# Patient Record
Sex: Female | Born: 1960 | Race: Black or African American | Hispanic: No | Marital: Single | Smoking: Current every day smoker
Health system: Southern US, Community
[De-identification: ages and names within clinical notes are randomized; demographics above are authoritative.]

## PROBLEM LIST (undated history)

## (undated) DIAGNOSIS — C3490 Malignant neoplasm of unspecified part of unspecified bronchus or lung: Secondary | ICD-10-CM

## (undated) HISTORY — DX: Malignant neoplasm of unspecified part of unspecified bronchus or lung: C34.90

---

## 2005-12-09 ENCOUNTER — Other Ambulatory Visit: Payer: Self-pay

## 2005-12-09 ENCOUNTER — Emergency Department: Payer: Self-pay | Admitting: Emergency Medicine

## 2009-05-08 ENCOUNTER — Emergency Department: Payer: Self-pay | Admitting: Emergency Medicine

## 2013-09-24 ENCOUNTER — Emergency Department: Payer: Self-pay | Admitting: Emergency Medicine

## 2013-09-24 LAB — URINALYSIS, COMPLETE
Bilirubin,UR: NEGATIVE
Glucose,UR: NEGATIVE mg/dL (ref 0–75)
Nitrite: NEGATIVE
PROTEIN: NEGATIVE
Ph: 5 (ref 4.5–8.0)
RBC,UR: 5 /HPF (ref 0–5)
Specific Gravity: 1.017 (ref 1.003–1.030)
Squamous Epithelial: 4

## 2013-09-26 LAB — URINE CULTURE

## 2013-11-07 ENCOUNTER — Inpatient Hospital Stay: Payer: Self-pay | Admitting: Internal Medicine

## 2013-11-07 LAB — BASIC METABOLIC PANEL
ANION GAP: 8 (ref 7–16)
BUN: 7 mg/dL (ref 7–18)
CHLORIDE: 100 mmol/L (ref 98–107)
CREATININE: 0.74 mg/dL (ref 0.60–1.30)
Calcium, Total: 9.2 mg/dL (ref 8.5–10.1)
Co2: 28 mmol/L (ref 21–32)
EGFR (Non-African Amer.): >60
GLUCOSE: 87 mg/dL (ref 65–99)
Osmolality: 269 (ref 275–301)
Potassium: 4.2 mmol/L (ref 3.5–5.1)
SODIUM: 136 mmol/L (ref 136–145)

## 2013-11-07 LAB — CBC
HCT: 45.9 % (ref 35.0–47.0)
HGB: 15.1 g/dL (ref 12.0–16.0)
MCH: 32.9 pg (ref 26.0–34.0)
MCHC: 32.9 g/dL (ref 32.0–36.0)
MCV: 100 fL (ref 80–100)
PLATELETS: 270 10*3/uL (ref 150–440)
RBC: 4.58 10*6/uL (ref 3.80–5.20)
RDW: 13.4 % (ref 11.5–14.5)
WBC: 4.3 10*3/uL (ref 3.6–11.0)

## 2013-11-07 LAB — RAPID HIV SCREEN (HIV 1/2 AB+AG)

## 2013-11-07 LAB — ETHANOL
Ethanol %: <0.003 % (ref 0.000–0.080)
Ethanol: <3 mg/dL

## 2013-11-07 LAB — TSH: Thyroid Stimulating Horm: 0.969 u[IU]/mL

## 2013-11-07 LAB — TROPONIN I: Troponin-I: <0.02 ng/mL

## 2013-11-08 LAB — CBC WITH DIFFERENTIAL/PLATELET
Basophil #: 0 10*3/uL (ref 0.0–0.1)
Basophil %: 0.1 %
EOS ABS: 0 10*3/uL (ref 0.0–0.7)
Eosinophil %: 0 %
HCT: 38.2 % (ref 35.0–47.0)
HGB: 12.5 g/dL (ref 12.0–16.0)
Lymphocyte #: 0.6 10*3/uL — ABNORMAL LOW (ref 1.0–3.6)
Lymphocyte %: 19.4 %
MCH: 33.2 pg (ref 26.0–34.0)
MCHC: 32.8 g/dL (ref 32.0–36.0)
MCV: 101 fL — AB (ref 80–100)
MONOS PCT: 1.5 %
Monocyte #: 0 x10 3/mm — ABNORMAL LOW (ref 0.2–0.9)
NEUTROS ABS: 2.3 10*3/uL (ref 1.4–6.5)
NEUTROS PCT: 79 %
PLATELETS: 235 10*3/uL (ref 150–440)
RBC: 3.77 10*6/uL — ABNORMAL LOW (ref 3.80–5.20)
RDW: 13.3 % (ref 11.5–14.5)
WBC: 2.9 10*3/uL — AB (ref 3.6–11.0)

## 2013-11-08 LAB — BASIC METABOLIC PANEL
Anion Gap: 8 (ref 7–16)
BUN: 9 mg/dL (ref 7–18)
CO2: 25 mmol/L (ref 21–32)
Calcium, Total: 7.6 mg/dL — ABNORMAL LOW (ref 8.5–10.1)
Chloride: 105 mmol/L (ref 98–107)
Creatinine: 0.54 mg/dL — ABNORMAL LOW (ref 0.60–1.30)
EGFR (African American): >60
EGFR (Non-African Amer.): >60
Glucose: 149 mg/dL — ABNORMAL HIGH (ref 65–99)
OSMOLALITY: 277 (ref 275–301)
Potassium: 4.3 mmol/L (ref 3.5–5.1)
SODIUM: 138 mmol/L (ref 136–145)

## 2013-11-09 LAB — DRUG SCREEN, URINE

## 2013-11-12 LAB — CULTURE, BLOOD (SINGLE)

## 2013-11-15 ENCOUNTER — Ambulatory Visit: Payer: Self-pay | Admitting: Oncology

## 2013-11-19 ENCOUNTER — Inpatient Hospital Stay: Payer: Self-pay | Admitting: Internal Medicine

## 2013-11-19 LAB — COMPREHENSIVE METABOLIC PANEL
ALK PHOS: 119 U/L — AB
AST: 32 U/L (ref 15–37)
Albumin: 2.5 g/dL — ABNORMAL LOW (ref 3.4–5.0)
Anion Gap: 10 (ref 7–16)
BUN: 8 mg/dL (ref 7–18)
Bilirubin,Total: 0.2 mg/dL (ref 0.2–1.0)
CALCIUM: 8.4 mg/dL — AB (ref 8.5–10.1)
CHLORIDE: 107 mmol/L (ref 98–107)
CO2: 26 mmol/L (ref 21–32)
Creatinine: 0.57 mg/dL — ABNORMAL LOW (ref 0.60–1.30)
EGFR (African American): >60
EGFR (Non-African Amer.): >60
GLUCOSE: 95 mg/dL (ref 65–99)
OSMOLALITY: 283 (ref 275–301)
POTASSIUM: 3.4 mmol/L — AB (ref 3.5–5.1)
SGPT (ALT): 47 U/L
SODIUM: 143 mmol/L (ref 136–145)
Total Protein: 7.2 g/dL (ref 6.4–8.2)

## 2013-11-19 LAB — DRUG SCREEN, URINE
AMPHETAMINES, UR SCREEN: NEGATIVE (ref ?–1000)
BENZODIAZEPINE, UR SCRN: NEGATIVE (ref ?–200)
Barbiturates, Ur Screen: NEGATIVE (ref ?–200)
COCAINE METABOLITE, UR ~~LOC~~: NEGATIVE (ref ?–300)
Cannabinoid 50 Ng, Ur ~~LOC~~: NEGATIVE (ref ?–50)
MDMA (ECSTASY) UR SCREEN: NEGATIVE (ref ?–500)
METHADONE, UR SCREEN: NEGATIVE (ref ?–300)
Opiate, Ur Screen: NEGATIVE (ref ?–300)
PHENCYCLIDINE (PCP) UR S: NEGATIVE (ref ?–25)
Tricyclic, Ur Screen: NEGATIVE (ref ?–1000)

## 2013-11-19 LAB — CBC
HCT: 38.7 % (ref 35.0–47.0)
HGB: 13 g/dL (ref 12.0–16.0)
MCH: 33.5 pg (ref 26.0–34.0)
MCHC: 33.7 g/dL (ref 32.0–36.0)
MCV: 100 fL (ref 80–100)
Platelet: 269 10*3/uL (ref 150–440)
RBC: 3.89 10*6/uL (ref 3.80–5.20)
RDW: 13.4 % (ref 11.5–14.5)
WBC: 4.6 10*3/uL (ref 3.6–11.0)

## 2013-11-19 LAB — PROTIME-INR
INR: 1
PROTHROMBIN TIME: 12.7 s (ref 11.5–14.7)

## 2013-11-19 LAB — TROPONIN I

## 2013-11-20 LAB — BASIC METABOLIC PANEL
ANION GAP: 8 (ref 7–16)
BUN: 11 mg/dL (ref 7–18)
CALCIUM: 8.4 mg/dL — AB (ref 8.5–10.1)
CHLORIDE: 107 mmol/L (ref 98–107)
Co2: 26 mmol/L (ref 21–32)
Creatinine: 0.77 mg/dL (ref 0.60–1.30)
EGFR (African American): >60
Glucose: 141 mg/dL — ABNORMAL HIGH (ref 65–99)
Osmolality: 283 (ref 275–301)
Potassium: 4.1 mmol/L (ref 3.5–5.1)
Sodium: 141 mmol/L (ref 136–145)

## 2013-11-20 LAB — CBC WITH DIFFERENTIAL/PLATELET
BASOS ABS: 0.1 10*3/uL (ref 0.0–0.1)
Basophil %: 0.7 %
Eosinophil #: 0 10*3/uL (ref 0.0–0.7)
Eosinophil %: 0 %
HCT: 38 % (ref 35.0–47.0)
HGB: 12.9 g/dL (ref 12.0–16.0)
LYMPHS PCT: 7 %
Lymphocyte #: 0.7 10*3/uL — ABNORMAL LOW (ref 1.0–3.6)
MCH: 34.1 pg — AB (ref 26.0–34.0)
MCHC: 33.9 g/dL (ref 32.0–36.0)
MCV: 101 fL — ABNORMAL HIGH (ref 80–100)
MONO ABS: 0.1 x10 3/mm — AB (ref 0.2–0.9)
Monocyte %: 1.2 %
NEUTROS PCT: 91.1 %
Neutrophil #: 9.1 10*3/uL — ABNORMAL HIGH (ref 1.4–6.5)
PLATELETS: 240 10*3/uL (ref 150–440)
RBC: 3.77 10*6/uL — AB (ref 3.80–5.20)
RDW: 13.5 % (ref 11.5–14.5)
WBC: 10 10*3/uL (ref 3.6–11.0)

## 2013-11-26 ENCOUNTER — Ambulatory Visit: Payer: Self-pay | Admitting: Oncology

## 2013-12-08 ENCOUNTER — Ambulatory Visit: Payer: Self-pay | Admitting: Vascular Surgery

## 2013-12-09 ENCOUNTER — Ambulatory Visit: Payer: Self-pay | Admitting: Oncology

## 2013-12-13 ENCOUNTER — Emergency Department: Payer: Self-pay | Admitting: Emergency Medicine

## 2013-12-13 LAB — COMPREHENSIVE METABOLIC PANEL
ANION GAP: 16 (ref 7–16)
Albumin: 3 g/dL — ABNORMAL LOW (ref 3.4–5.0)
Alkaline Phosphatase: 156 U/L — ABNORMAL HIGH
BUN: 7 mg/dL (ref 7–18)
Bilirubin,Total: 0.5 mg/dL (ref 0.2–1.0)
Calcium, Total: 8.7 mg/dL (ref 8.5–10.1)
Chloride: 99 mmol/L (ref 98–107)
Co2: 19 mmol/L — ABNORMAL LOW (ref 21–32)
Creatinine: 0.79 mg/dL (ref 0.60–1.30)
EGFR (African American): >60
EGFR (Non-African Amer.): >60
GLUCOSE: 100 mg/dL — AB (ref 65–99)
OSMOLALITY: 266 (ref 275–301)
Potassium: 3.6 mmol/L (ref 3.5–5.1)
SGOT(AST): 38 U/L — ABNORMAL HIGH (ref 15–37)
SGPT (ALT): 37 U/L
Sodium: 134 mmol/L — ABNORMAL LOW (ref 136–145)
TOTAL PROTEIN: 7.3 g/dL (ref 6.4–8.2)

## 2013-12-13 LAB — URINALYSIS, COMPLETE
BILIRUBIN, UR: NEGATIVE
GLUCOSE, UR: NEGATIVE mg/dL (ref 0–75)
KETONE: NEGATIVE
NITRITE: NEGATIVE
PH: 5 (ref 4.5–8.0)
Protein: NEGATIVE
SPECIFIC GRAVITY: 1.01 (ref 1.003–1.030)
Squamous Epithelial: 4
WBC UR: 13 /HPF (ref 0–5)

## 2013-12-13 LAB — CBC
HCT: 41.8 % (ref 35.0–47.0)
HGB: 13.6 g/dL (ref 12.0–16.0)
MCH: 32 pg (ref 26.0–34.0)
MCHC: 32.5 g/dL (ref 32.0–36.0)
MCV: 98 fL (ref 80–100)
Platelet: 209 10*3/uL (ref 150–440)
RBC: 4.25 10*6/uL (ref 3.80–5.20)
RDW: 13.9 % (ref 11.5–14.5)
WBC: 5.8 10*3/uL (ref 3.6–11.0)

## 2013-12-16 ENCOUNTER — Ambulatory Visit: Payer: Self-pay | Admitting: Oncology

## 2013-12-19 LAB — COMPREHENSIVE METABOLIC PANEL
Albumin: 3.1 g/dL — ABNORMAL LOW (ref 3.4–5.0)
Alkaline Phosphatase: 123 U/L — ABNORMAL HIGH
Anion Gap: 10 (ref 7–16)
BUN: 9 mg/dL (ref 7–18)
Bilirubin,Total: 0.4 mg/dL (ref 0.2–1.0)
CHLORIDE: 96 mmol/L — AB (ref 98–107)
Calcium, Total: 8.9 mg/dL (ref 8.5–10.1)
Co2: 27 mmol/L (ref 21–32)
Creatinine: 0.67 mg/dL (ref 0.60–1.30)
EGFR (African American): >60
Glucose: 115 mg/dL — ABNORMAL HIGH (ref 65–99)
Osmolality: 266 (ref 275–301)
Potassium: 3.2 mmol/L — ABNORMAL LOW (ref 3.5–5.1)
SGOT(AST): 19 U/L (ref 15–37)
SGPT (ALT): 21 U/L
Sodium: 133 mmol/L — ABNORMAL LOW (ref 136–145)
Total Protein: 7.2 g/dL (ref 6.4–8.2)

## 2013-12-19 LAB — CBC CANCER CENTER
BASOS ABS: 0 x10 3/mm (ref 0.0–0.1)
Basophil %: 0.7 %
EOS PCT: 0.4 %
Eosinophil #: 0 x10 3/mm (ref 0.0–0.7)
HCT: 39.2 % (ref 35.0–47.0)
HGB: 13.2 g/dL (ref 12.0–16.0)
LYMPHS PCT: 15.1 %
Lymphocyte #: 0.9 x10 3/mm — ABNORMAL LOW (ref 1.0–3.6)
MCH: 32.4 pg (ref 26.0–34.0)
MCHC: 33.6 g/dL (ref 32.0–36.0)
MCV: 96 fL (ref 80–100)
Monocyte #: 0.5 x10 3/mm (ref 0.2–0.9)
Monocyte %: 8.4 %
Neutrophil #: 4.5 x10 3/mm (ref 1.4–6.5)
Neutrophil %: 75.4 %
PLATELETS: 285 x10 3/mm (ref 150–440)
RBC: 4.07 10*6/uL (ref 3.80–5.20)
RDW: 13.4 % (ref 11.5–14.5)
WBC: 6 x10 3/mm (ref 3.6–11.0)

## 2013-12-19 LAB — MAGNESIUM: MAGNESIUM: 1.4 mg/dL — AB

## 2013-12-26 LAB — COMPREHENSIVE METABOLIC PANEL
ALBUMIN: 3 g/dL — AB (ref 3.4–5.0)
ALK PHOS: 190 U/L — AB
ALT: 70 U/L — AB
ANION GAP: 9 (ref 7–16)
AST: 42 U/L — AB (ref 15–37)
BUN: 15 mg/dL (ref 7–18)
Bilirubin,Total: 0.3 mg/dL (ref 0.2–1.0)
CALCIUM: 8.3 mg/dL — AB (ref 8.5–10.1)
CREATININE: 0.69 mg/dL (ref 0.60–1.30)
Chloride: 96 mmol/L — ABNORMAL LOW (ref 98–107)
Co2: 31 mmol/L (ref 21–32)
EGFR (African American): >60
EGFR (Non-African Amer.): >60
Glucose: 102 mg/dL — ABNORMAL HIGH (ref 65–99)
Osmolality: 273 (ref 275–301)
Potassium: 2.8 mmol/L — ABNORMAL LOW (ref 3.5–5.1)
Sodium: 136 mmol/L (ref 136–145)
TOTAL PROTEIN: 7.1 g/dL (ref 6.4–8.2)

## 2013-12-26 LAB — CBC CANCER CENTER
Basophil #: 0 x10 3/mm (ref 0.0–0.1)
Basophil %: 0.6 %
EOS ABS: 0 x10 3/mm (ref 0.0–0.7)
EOS PCT: 0.5 %
HCT: 40 % (ref 35.0–47.0)
HGB: 13.1 g/dL (ref 12.0–16.0)
Lymphocyte #: 0.8 x10 3/mm — ABNORMAL LOW (ref 1.0–3.6)
Lymphocyte %: 31.8 %
MCH: 31.9 pg (ref 26.0–34.0)
MCHC: 32.8 g/dL (ref 32.0–36.0)
MCV: 97 fL (ref 80–100)
Monocyte #: 0.1 x10 3/mm — ABNORMAL LOW (ref 0.2–0.9)
Monocyte %: 5.1 %
NEUTROS PCT: 62 %
Neutrophil #: 1.5 x10 3/mm (ref 1.4–6.5)
PLATELETS: 222 x10 3/mm (ref 150–440)
RBC: 4.12 10*6/uL (ref 3.80–5.20)
RDW: 13.2 % (ref 11.5–14.5)
WBC: 2.5 x10 3/mm — AB (ref 3.6–11.0)

## 2013-12-26 LAB — MAGNESIUM: Magnesium: 1.5 mg/dL — ABNORMAL LOW

## 2014-01-08 LAB — COMPREHENSIVE METABOLIC PANEL
ALK PHOS: 193 U/L — AB
ANION GAP: 8 (ref 7–16)
AST: 20 U/L (ref 15–37)
Albumin: 3.2 g/dL — ABNORMAL LOW (ref 3.4–5.0)
BILIRUBIN TOTAL: 0.2 mg/dL (ref 0.2–1.0)
BUN: 12 mg/dL (ref 7–18)
CALCIUM: 8.5 mg/dL (ref 8.5–10.1)
CO2: 28 mmol/L (ref 21–32)
CREATININE: 0.68 mg/dL (ref 0.60–1.30)
Chloride: 102 mmol/L (ref 98–107)
EGFR (Non-African Amer.): >60
GLUCOSE: 128 mg/dL — AB (ref 65–99)
OSMOLALITY: 277 (ref 275–301)
Potassium: 3.9 mmol/L (ref 3.5–5.1)
SGPT (ALT): 30 U/L
Sodium: 138 mmol/L (ref 136–145)
Total Protein: 7.2 g/dL (ref 6.4–8.2)

## 2014-01-08 LAB — CBC CANCER CENTER
BASOS ABS: 0 x10 3/mm (ref 0.0–0.1)
Basophil %: 0.4 %
Eosinophil #: 0 x10 3/mm (ref 0.0–0.7)
Eosinophil %: 0.1 %
HCT: 37.5 % (ref 35.0–47.0)
HGB: 12.8 g/dL (ref 12.0–16.0)
Lymphocyte #: 0.9 x10 3/mm — ABNORMAL LOW (ref 1.0–3.6)
Lymphocyte %: 12.7 %
MCH: 32.9 pg (ref 26.0–34.0)
MCHC: 34 g/dL (ref 32.0–36.0)
MCV: 97 fL (ref 80–100)
MONOS PCT: 7.1 %
Monocyte #: 0.5 x10 3/mm (ref 0.2–0.9)
NEUTROS PCT: 79.7 %
Neutrophil #: 5.6 x10 3/mm (ref 1.4–6.5)
Platelet: 178 x10 3/mm (ref 150–440)
RBC: 3.88 10*6/uL (ref 3.80–5.20)
RDW: 13.7 % (ref 11.5–14.5)
WBC: 7.1 x10 3/mm (ref 3.6–11.0)

## 2014-01-15 ENCOUNTER — Ambulatory Visit: Payer: Self-pay | Admitting: Oncology

## 2014-01-29 LAB — COMPREHENSIVE METABOLIC PANEL
ALK PHOS: 148 U/L — AB
ANION GAP: 8 (ref 7–16)
Albumin: 3 g/dL — ABNORMAL LOW (ref 3.4–5.0)
BILIRUBIN TOTAL: 0.3 mg/dL (ref 0.2–1.0)
BUN: 7 mg/dL (ref 7–18)
Calcium, Total: 9.3 mg/dL (ref 8.5–10.1)
Chloride: 106 mmol/L (ref 98–107)
Co2: 26 mmol/L (ref 21–32)
Creatinine: 0.79 mg/dL (ref 0.60–1.30)
EGFR (Non-African Amer.): >60
GLUCOSE: 114 mg/dL — AB (ref 65–99)
Osmolality: 278 (ref 275–301)
Potassium: 3.5 mmol/L (ref 3.5–5.1)
SGOT(AST): 21 U/L (ref 15–37)
SGPT (ALT): 33 U/L
Sodium: 140 mmol/L (ref 136–145)
Total Protein: 7 g/dL (ref 6.4–8.2)

## 2014-01-29 LAB — CBC CANCER CENTER
BASOS PCT: 0.5 %
Basophil #: 0 x10 3/mm (ref 0.0–0.1)
Eosinophil #: 0 x10 3/mm (ref 0.0–0.7)
Eosinophil %: 0.2 %
HCT: 35 % (ref 35.0–47.0)
HGB: 11.8 g/dL — ABNORMAL LOW (ref 12.0–16.0)
LYMPHS ABS: 0.8 x10 3/mm — AB (ref 1.0–3.6)
LYMPHS PCT: 25.7 %
MCH: 32.6 pg (ref 26.0–34.0)
MCHC: 33.9 g/dL (ref 32.0–36.0)
MCV: 96 fL (ref 80–100)
Monocyte #: 0.4 x10 3/mm (ref 0.2–0.9)
Monocyte %: 14.1 %
NEUTROS PCT: 59.5 %
Neutrophil #: 1.8 x10 3/mm (ref 1.4–6.5)
Platelet: 321 x10 3/mm (ref 150–440)
RBC: 3.64 10*6/uL — AB (ref 3.80–5.20)
RDW: 15.2 % — AB (ref 11.5–14.5)
WBC: 3 x10 3/mm — ABNORMAL LOW (ref 3.6–11.0)

## 2014-02-15 ENCOUNTER — Ambulatory Visit: Payer: Self-pay | Admitting: Oncology

## 2014-02-19 LAB — CBC CANCER CENTER
BASOS ABS: 0 x10 3/mm (ref 0.0–0.1)
Basophil %: 0.4 %
EOS PCT: 0.1 %
Eosinophil #: 0 x10 3/mm (ref 0.0–0.7)
HCT: 33.6 % — ABNORMAL LOW (ref 35.0–47.0)
HGB: 11.3 g/dL — AB (ref 12.0–16.0)
LYMPHS PCT: 19.5 %
Lymphocyte #: 0.9 x10 3/mm — ABNORMAL LOW (ref 1.0–3.6)
MCH: 32.7 pg (ref 26.0–34.0)
MCHC: 33.5 g/dL (ref 32.0–36.0)
MCV: 98 fL (ref 80–100)
MONO ABS: 0.7 x10 3/mm (ref 0.2–0.9)
Monocyte %: 15.1 %
NEUTROS PCT: 64.9 %
Neutrophil #: 2.9 x10 3/mm (ref 1.4–6.5)
Platelet: 236 x10 3/mm (ref 150–440)
RBC: 3.45 10*6/uL — ABNORMAL LOW (ref 3.80–5.20)
RDW: 17 % — AB (ref 11.5–14.5)
WBC: 4.4 x10 3/mm (ref 3.6–11.0)

## 2014-02-19 LAB — COMPREHENSIVE METABOLIC PANEL
ALK PHOS: 122 U/L — AB
ALT: 29 U/L
ANION GAP: 11 (ref 7–16)
Albumin: 3.3 g/dL — ABNORMAL LOW (ref 3.4–5.0)
BILIRUBIN TOTAL: 0.4 mg/dL (ref 0.2–1.0)
BUN: 6 mg/dL — ABNORMAL LOW (ref 7–18)
Calcium, Total: 9.3 mg/dL (ref 8.5–10.1)
Chloride: 100 mmol/L (ref 98–107)
Co2: 27 mmol/L (ref 21–32)
Creatinine: 0.68 mg/dL (ref 0.60–1.30)
EGFR (African American): >60
EGFR (Non-African Amer.): >60
Glucose: 107 mg/dL — ABNORMAL HIGH (ref 65–99)
Osmolality: 274 (ref 275–301)
Potassium: 3.5 mmol/L (ref 3.5–5.1)
SGOT(AST): 24 U/L (ref 15–37)
SODIUM: 138 mmol/L (ref 136–145)
Total Protein: 7.3 g/dL (ref 6.4–8.2)

## 2014-03-17 ENCOUNTER — Ambulatory Visit: Payer: Self-pay | Admitting: Oncology

## 2014-03-19 LAB — COMPREHENSIVE METABOLIC PANEL
ALBUMIN: 3.4 g/dL (ref 3.4–5.0)
AST: 24 U/L (ref 15–37)
Alkaline Phosphatase: 99 U/L
Anion Gap: 10 (ref 7–16)
BILIRUBIN TOTAL: 0.8 mg/dL (ref 0.2–1.0)
BUN: 10 mg/dL (ref 7–18)
Calcium, Total: 9.2 mg/dL (ref 8.5–10.1)
Chloride: 100 mmol/L (ref 98–107)
Co2: 27 mmol/L (ref 21–32)
Creatinine: 0.69 mg/dL (ref 0.60–1.30)
EGFR (African American): >60
EGFR (Non-African Amer.): >60
Glucose: 99 mg/dL (ref 65–99)
OSMOLALITY: 273 (ref 275–301)
POTASSIUM: 3.7 mmol/L (ref 3.5–5.1)
SGPT (ALT): 24 U/L
SODIUM: 137 mmol/L (ref 136–145)
TOTAL PROTEIN: 7.5 g/dL (ref 6.4–8.2)

## 2014-03-19 LAB — CBC CANCER CENTER
Basophil #: 0 x10 3/mm (ref 0.0–0.1)
Basophil %: 0.9 %
Eosinophil #: 0 x10 3/mm (ref 0.0–0.7)
Eosinophil %: 0.5 %
HCT: 35 % (ref 35.0–47.0)
HGB: 11.6 g/dL — ABNORMAL LOW (ref 12.0–16.0)
LYMPHS PCT: 22.5 %
Lymphocyte #: 0.9 x10 3/mm — ABNORMAL LOW (ref 1.0–3.6)
MCH: 32.9 pg (ref 26.0–34.0)
MCHC: 33.2 g/dL (ref 32.0–36.0)
MCV: 99 fL (ref 80–100)
MONO ABS: 0.4 x10 3/mm (ref 0.2–0.9)
Monocyte %: 9 %
Neutrophil #: 2.7 x10 3/mm (ref 1.4–6.5)
Neutrophil %: 67.1 %
PLATELETS: 243 x10 3/mm (ref 150–440)
RBC: 3.53 10*6/uL — ABNORMAL LOW (ref 3.80–5.20)
RDW: 17.2 % — ABNORMAL HIGH (ref 11.5–14.5)
WBC: 4.1 x10 3/mm (ref 3.6–11.0)

## 2014-04-07 LAB — CBC CANCER CENTER
BASOS ABS: 0 x10 3/mm (ref 0.0–0.1)
Basophil %: 0.4 %
Eosinophil #: 0 x10 3/mm (ref 0.0–0.7)
Eosinophil %: 0 %
HCT: 34.1 % — AB (ref 35.0–47.0)
HGB: 11.3 g/dL — ABNORMAL LOW (ref 12.0–16.0)
Lymphocyte #: 1.4 x10 3/mm (ref 1.0–3.6)
Lymphocyte %: 16.9 %
MCH: 33.3 pg (ref 26.0–34.0)
MCHC: 33.2 g/dL (ref 32.0–36.0)
MCV: 100 fL (ref 80–100)
MONOS PCT: 11.4 %
Monocyte #: 0.9 x10 3/mm (ref 0.2–0.9)
Neutrophil #: 5.8 x10 3/mm (ref 1.4–6.5)
Neutrophil %: 71.3 %
PLATELETS: 201 x10 3/mm (ref 150–440)
RBC: 3.39 10*6/uL — ABNORMAL LOW (ref 3.80–5.20)
RDW: 16.9 % — ABNORMAL HIGH (ref 11.5–14.5)
WBC: 8.2 x10 3/mm (ref 3.6–11.0)

## 2014-04-07 LAB — COMPREHENSIVE METABOLIC PANEL
ALT: 73 U/L — AB
ANION GAP: 9 (ref 7–16)
Albumin: 3.2 g/dL — ABNORMAL LOW (ref 3.4–5.0)
Alkaline Phosphatase: 102 U/L
BILIRUBIN TOTAL: 0.2 mg/dL (ref 0.2–1.0)
BUN: 13 mg/dL (ref 7–18)
Calcium, Total: 8.7 mg/dL (ref 8.5–10.1)
Chloride: 99 mmol/L (ref 98–107)
Co2: 29 mmol/L (ref 21–32)
Creatinine: 0.6 mg/dL (ref 0.60–1.30)
Glucose: 96 mg/dL (ref 65–99)
OSMOLALITY: 274 (ref 275–301)
POTASSIUM: 4.2 mmol/L (ref 3.5–5.1)
SGOT(AST): 47 U/L — ABNORMAL HIGH (ref 15–37)
Sodium: 137 mmol/L (ref 136–145)
Total Protein: 7.2 g/dL (ref 6.4–8.2)

## 2014-04-17 ENCOUNTER — Ambulatory Visit: Payer: Self-pay | Admitting: Oncology

## 2014-05-05 LAB — CBC CANCER CENTER
BASOS ABS: 0.1 x10 3/mm (ref 0.0–0.1)
Basophil %: 1.1 %
EOS ABS: 0 x10 3/mm (ref 0.0–0.7)
EOS PCT: 0.2 %
HCT: 36.3 % (ref 35.0–47.0)
HGB: 12 g/dL (ref 12.0–16.0)
LYMPHS PCT: 16.6 %
Lymphocyte #: 1.1 x10 3/mm (ref 1.0–3.6)
MCH: 33.5 pg (ref 26.0–34.0)
MCHC: 33.2 g/dL (ref 32.0–36.0)
MCV: 101 fL — ABNORMAL HIGH (ref 80–100)
Monocyte #: 0.9 x10 3/mm (ref 0.2–0.9)
Monocyte %: 12.8 %
NEUTROS PCT: 69.3 %
Neutrophil #: 4.6 x10 3/mm (ref 1.4–6.5)
Platelet: 213 x10 3/mm (ref 150–440)
RBC: 3.59 10*6/uL — AB (ref 3.80–5.20)
RDW: 17.2 % — ABNORMAL HIGH (ref 11.5–14.5)
WBC: 6.7 x10 3/mm (ref 3.6–11.0)

## 2014-05-05 LAB — COMPREHENSIVE METABOLIC PANEL
ALBUMIN: 3.3 g/dL — AB (ref 3.4–5.0)
AST: 11 U/L — AB (ref 15–37)
Alkaline Phosphatase: 103 U/L
Anion Gap: 9 (ref 7–16)
BUN: 13 mg/dL (ref 7–18)
Bilirubin,Total: 0.4 mg/dL (ref 0.2–1.0)
CALCIUM: 8.4 mg/dL — AB (ref 8.5–10.1)
CO2: 29 mmol/L (ref 21–32)
Chloride: 101 mmol/L (ref 98–107)
Creatinine: 0.62 mg/dL (ref 0.60–1.30)
EGFR (African American): >60
EGFR (Non-African Amer.): >60
GLUCOSE: 120 mg/dL — AB (ref 65–99)
Osmolality: 279 (ref 275–301)
Potassium: 3.2 mmol/L — ABNORMAL LOW (ref 3.5–5.1)
SGPT (ALT): 21 U/L
Sodium: 139 mmol/L (ref 136–145)
Total Protein: 7.3 g/dL (ref 6.4–8.2)

## 2014-05-18 ENCOUNTER — Ambulatory Visit: Payer: Self-pay | Admitting: Oncology

## 2014-06-16 ENCOUNTER — Ambulatory Visit
Admit: 2014-06-16 | Disposition: A | Discharge status: Home / Self Care | Payer: Self-pay | Attending: Oncology | Admitting: Oncology

## 2014-06-23 LAB — CREATININE, SERUM

## 2014-07-14 LAB — PROTEIN, URINE, RANDOM: PROTEIN, URINE: 9 mg/dL (ref 0–9)

## 2014-07-17 ENCOUNTER — Ambulatory Visit
Admit: 2014-07-17 | Disposition: A | Discharge status: Home / Self Care | Payer: Self-pay | Attending: Oncology | Admitting: Oncology

## 2014-08-05 LAB — COMPREHENSIVE METABOLIC PANEL
ANION GAP: 11 (ref 7–16)
Albumin: 4.4 g/dL
Alkaline Phosphatase: 68 U/L
BILIRUBIN TOTAL: 1.1 mg/dL
BUN: 19 mg/dL
CHLORIDE: 100 mmol/L — AB
CO2: 27 mmol/L
CREATININE: 0.64 mg/dL
Calcium, Total: 9.8 mg/dL
EGFR (African American): >60
Glucose: 115 mg/dL — ABNORMAL HIGH
POTASSIUM: 4.4 mmol/L
SGOT(AST): 27 U/L
SGPT (ALT): 27 U/L
Sodium: 138 mmol/L
TOTAL PROTEIN: 8.2 g/dL — AB

## 2014-08-05 LAB — URINALYSIS, COMPLETE
Bilirubin,UR: NEGATIVE
Blood: NEGATIVE
Glucose,UR: NEGATIVE mg/dL (ref 0–75)
Leukocyte Esterase: NEGATIVE
NITRITE: NEGATIVE
PH: 7 (ref 4.5–8.0)
Specific Gravity: 1.049 (ref 1.003–1.030)

## 2014-08-05 LAB — DRUG SCREEN, URINE
Amphetamines, Ur Screen: NEGATIVE
Barbiturates, Ur Screen: NEGATIVE
Benzodiazepine, Ur Scrn: NEGATIVE
CANNABINOID 50 NG, UR ~~LOC~~: NEGATIVE
Cocaine Metabolite,Ur ~~LOC~~: POSITIVE
MDMA (ECSTASY) UR SCREEN: NEGATIVE
METHADONE, UR SCREEN: NEGATIVE
Opiate, Ur Screen: NEGATIVE
PHENCYCLIDINE (PCP) UR S: NEGATIVE
Tricyclic, Ur Screen: NEGATIVE

## 2014-08-05 LAB — CBC
HCT: 47.2 % — ABNORMAL HIGH (ref 35.0–47.0)
HGB: 15.6 g/dL (ref 12.0–16.0)
MCH: 32.8 pg (ref 26.0–34.0)
MCHC: 33.1 g/dL (ref 32.0–36.0)
MCV: 99 fL (ref 80–100)
PLATELETS: 190 10*3/uL (ref 150–440)
RBC: 4.77 10*6/uL (ref 3.80–5.20)
RDW: 14.4 % (ref 11.5–14.5)
WBC: 6.8 10*3/uL (ref 3.6–11.0)

## 2014-08-05 LAB — AMMONIA: Ammonia, Plasma: 11 umol/L

## 2014-08-05 LAB — TROPONIN I: Troponin-I: <0.03 ng/mL

## 2014-08-06 ENCOUNTER — Inpatient Hospital Stay
Admit: 2014-08-06 | Disposition: A | Discharge status: Hospice | Payer: Self-pay | Attending: Internal Medicine | Admitting: Internal Medicine

## 2014-08-06 LAB — AMMONIA: Ammonia, Plasma: 17 umol/L

## 2014-08-07 LAB — CBC WITH DIFFERENTIAL/PLATELET
BASOS ABS: 0 10*3/uL (ref 0.0–0.1)
Basophil %: 0.6 %
EOS ABS: 0 10*3/uL (ref 0.0–0.7)
Eosinophil %: 0.8 %
HCT: 37.4 % (ref 35.0–47.0)
HGB: 12.4 g/dL (ref 12.0–16.0)
LYMPHS ABS: 0.9 10*3/uL — AB (ref 1.0–3.6)
Lymphocyte %: 25.7 %
MCH: 32.6 pg (ref 26.0–34.0)
MCHC: 33.1 g/dL (ref 32.0–36.0)
MCV: 98 fL (ref 80–100)
MONOS PCT: 11.4 %
Monocyte #: 0.4 x10 3/mm (ref 0.2–0.9)
Neutrophil #: 2.2 10*3/uL (ref 1.4–6.5)
Neutrophil %: 61.5 %
PLATELETS: 162 10*3/uL (ref 150–440)
RBC: 3.8 10*6/uL (ref 3.80–5.20)
RDW: 14.1 % (ref 11.5–14.5)
WBC: 3.6 10*3/uL (ref 3.6–11.0)

## 2014-08-07 LAB — BASIC METABOLIC PANEL
ANION GAP: 3 — AB (ref 7–16)
BUN: 9 mg/dL
CALCIUM: 8.3 mg/dL — AB
Chloride: 111 mmol/L
Co2: 24 mmol/L
Creatinine: 0.44 mg/dL
EGFR (African American): >60
EGFR (Non-African Amer.): >60
Glucose: 98 mg/dL
Potassium: 3.6 mmol/L
Sodium: 138 mmol/L

## 2014-08-08 NOTE — H&P (Signed)
PATIENT NAME:  Virginia Carlson, [name] MR#:  [id number] DATE OF BIRTH:  02-01-1961  DATE OF ADMISSION:  11/07/2013  PRIMARY CARE PROVIDER: None.   EMERGENCY DEPARTMENT REFERRING PHYSICIAN: Dr. Reita Cliche.   CHIEF COMPLAINT: Shortness of breath and cough ongoing for 2 weeks.   HISTORY OF PRESENT ILLNESS: The patient is a 54 year old African American female with a history of nicotine addiction, alcohol use, history of cocaine use presents with shortness of breath and coughing, ongoing for the past 2 weeks. She reports that she has had a productive cough with darkish color yellowish sputum. Has not had any fevers, but has had progressive shortness of breath and has been wheezing. She also has had a right-sided chest pain. She has lost about 3 pounds in the past few weeks. She denies any nausea, vomiting, or diarrhea. Denies any abdominal pain. Does complain of wheezing. No history of COPD or any other lung disease.   PAST MEDICAL HISTORY: None.   PAST SURGICAL HISTORY: None.   ALLERGIES: NONE.   MEDICATIONS: None.   SOCIAL HISTORY: Smokes 1 pack per day for 20 years. Drinks two 40 ounces daily.  Drug use, cocaine use.   FAMILY HISTORY: No history of lung-related disease. There is some sort of cancer. She is not familiar with what type of cancer.   REVIEW OF SYSTEMS:  CONSTITUTIONAL: Denies any fevers. Complains of fatigue, weakness, and weight loss.  EYES: No blurred or double vision. No pain. No redness. No inflammation. No glaucoma. No cataracts.  EAR, NOSE, AND THROAT: No tinnitus. No ear pain. No hearing loss. No seasonal or year-round allergies. No epistaxis. No nasal discharge. No difficulty swallowing.  RESPIRATORY: Complains of cough, productive wheezing, no hemoptysis. Complains of dyspnea. Complains of right-sided chest pain. No COPD. No TB. No pneumonia in the past.  CARDIOVASCULAR: Denies any orthopnea, edema, or arrhythmia. Complains of dyspnea on exertion. No palpitation. No syncope.   GASTROINTESTINAL: No nausea, vomiting, diarrhea. No abdominal pain. No hematemesis. No melena. No ulcer. No GERD. No IBS. No jaundice. No rectal bleed.  GENITOURINARY: Denies any dysuria, hematuria, renal calculus, or frequency.  ENDOCRINE: Denies any polyuria, nocturia, or thyroid problems.  HEMATOLOGIC AND LYMPHATIC: Denies anemia, easy bruisability or bleeding.  SKIN: No acne. No rash.  MUSCULOSKELETAL: Denies any neck pain in the neck, back or shoulder.  NEUROLOGICAL: No numbness, CVA, or TIA.  PSYCHIATRIC: No anxiety, insomnia or ADD.   PHYSICAL EXAMINATION:  VITAL SIGNS: Temperature 98.2, pulse 112, respirations 26, blood pressure 117/78, O2 88% on room air.  GENERAL: The patient is a thin African American in mild distress with mild tachypneic and tachycardia.  HEENT: Head atraumatic, normocephalic. Pupils equally round and reactive to light and accommodation. There is no conjunctival pallor. No scleral icterus. Nasal exam shows no drainage or ulceration.  Oropharynx is clear without any exudate.  NECK: Supple without any JVD.  CARDIOVASCULAR: Tachycardic. No murmurs, rubs, clicks, or gallops.  LUNGS: Right-sided rhonchi without any active wheezing currently; was wheezing earlier. There is some accessory usage.  ABDOMEN: Soft, nontender, nondistended. Positive bowel sounds x 4. There is no hepatosplenomegaly.  EXTREMITIES: No clubbing, cyanosis, or edema.  SKIN: No rash.  LYMPHATICS: No lymph nodes palpable.  VASCULAR: Good DP, PT pulses.  PSYCHIATRIC: Not anxious or depressed.  NEUROLOGIC: Awake, alert, oriented x 3. No focal deficits JVD. Pulses.   LABORATORY DATA: Glucose 87, BUN 7, creatinine 0.74, sodium 136, potassium 4.2, chloride 100, CO2 28, calcium 9.2. Troponin less than 0.02. WBC 4.3,  hemoglobin 15.1, platelet count 270. PA and lateral chest x-ray shows pneumonia involving the entire right lung, most confluent in the right middle lobe where there is associated atelectasis.  Small right pleural effusion.  ASSESSMENT AND PLAN: The patient is a 54 year old African American female with history of nicotine addiction, alcohol use, and cocaine use who presents with shortness of breath and cough.   1.  Acute respiratory failure evident by tachypnea and hypoxia due to the extensive right-sided pneumonia. In light of somebody who uses alcohol and cocaine, do need to consider aspiration pneumonia. We will treat her with IV Levaquin, that should treat community-acquired pneumonia, as well as aspiration pneumonia. The appearance of the pneumonia seems to be atypical.  I will have pulmonary come evaluate the patient and get a sputum culture. I will also check HIV in this patient with drug use.   2.  Possible chronic obstructive pulmonary with flare. We will treat her with nebulizers, IV Solu-Medrol, IV Levaquin, and started her on Advair and Spiriva.   3.  Nicotine addiction. Smoking cessation, 4 minutes spent. Strongly recommended she stop smoking. Nicotine patch will be started.   4.  Alcohol abuse. The patient will be on a CIWA protocol and be monitored closely for any evidence of withdrawal.   5. Sinus tachycardia, likely related to underlying pulmonary process. I will keep monitor on telemetry. I will start her on a low-dose diltiazem. Hold if blood pressure is low.   6.  Miscellaneous. The patient will be on Lovenox for deep vein thrombosis prophylaxis.   TIME SPENT: 60 minutes.    ____________________________ Lafonda Mosses Posey Pronto, MD shp:ts D: 11/07/2013 15:03:35 ET T: 11/07/2013 16:33:10 ET JOB#: [id number]  cc: Jaquilla Woodroof H. Posey Pronto, MD, <Dictator> Alric Seton MD ELECTRONICALLY SIGNED 11/18/2013 8:27

## 2014-08-08 NOTE — Discharge Summary (Signed)
PATIENT NAME:  Virginia Carlson, Virginia Carlson MR#:  [id number] DATE OF BIRTH:  09-21-1960  DATE OF ADMISSION:  11/19/2013 DATE OF DISCHARGE:    ADMITTING PHYSICIAN: Gladstone Lighter, MD   DISCHARGING PHYSICIAN: Gladstone Lighter, MD   PRIMARY CARE PHYSICIAN: [facility].    CONSULTATION: In the hospital.   PULMONARY CONSULTATION: Dr. Mortimer Fries.   DISCHARGE DIAGNOSES: 1. Acute chronic obstructive pulmonary disease exacerbation. Likely chronic obstructive pulmonary disease with underlying right lung fibrosis.  2.  Right lung nodule, status post endobronchial ultrasound and biopsy.   DISCHARGE HOME MEDICATIONS:  1.  Advair 250/50 one puff b.i.d.  2.  Spiriva inhaler 1 puff daily.  3.  Levaquin 500 mg p.o. daily for 3 more days.  4.  Albuterol inhaler 2 puffs q. 6 hours p.r.n. for shortness of breath.  5.  Prednisone taper over 12 days.   DISCHARGE HOME OXYGEN: None.   DISCHARGE DIET: Regular diet.   DISCHARGE ACTIVITY: As tolerated.    FOLLOWUP INSTRUCTIONS:  1.  PCP followup in 2 weeks.  2.  Advised to quit smoking.   LABORATORIES AND IMAGING STUDIES PRIOR TO DISCHARGE: WBC 10.0, hemoglobin 12.9, hematocrit 38.0, platelet count 240. Sodium 141, potassium 4.1, chloride 107, bicarbonate 26, BUN 11, creatinine 0.7, glucose 141, and calcium of 8.4. Urine toxicology screen is negative. Chest x-ray on admission showing stable appearance of chest, extensive heterogenous interstitial reticular opacities right mid and lower lung, likely underlying fibrosis. Left lung is clear. Troponins negative.   BRIEF HOSPITAL COURSE: Virginia Carlson is a 54 year old African-American female with past medical history significant for only smoking who was just admitted to the hospital 2 weeks prior for COPD exacerbation, noted to have a right lung mass; however, refused biopsy at the time. She comes back again after finishing her steroid course with  difficulty breathing.   Acute COPD exacerbation. Continues to smoke.  After the steroids were finished, she felt the wheezing came back; however, she waited until it got worse. Initially on BiPAP in the ER, now stable. Chest x-ray revealing same fibrosis of right mid and lower lung, and the right lung nodule. She was started on IV steroids. Nebulizers and inhalers were continued. Seen by Dr. Mortimer Fries. The patient was agreeable for biopsy of the right lung nodule this time. She had an EBUS and biopsy done and the pathology results are pending. Her breathing has improved. She is off the oxygen and is being discharged on oral steroids, inhalers, and Levaquin for 2 more days. She has not been seen by Virginia Carlson yet, and has an appointment in the next 3 weeks. She was strongly advised to follow up with the appointment and call for results of her and lung biopsy. If anything concerning, the patient will be called with the results. She was strongly advised to quit smoking.    Her course has been otherwise uneventful in the hospital.   DISCHARGE CONDITION: Stable.   DISCHARGE DISPOSITION: Home.   TIME SPENT ON DISCHARGE: 40 minutes.    ____________________________ Gladstone Lighter, MD rk:at D: 11/21/2013 13:03:51 ET T: 11/21/2013 14:00:18 ET JOB#: [id number]  cc: Gladstone Lighter, MD, <Dictator> [place] Gladstone Lighter MD ELECTRONICALLY SIGNED 12/02/2013 14:12

## 2014-08-08 NOTE — Op Note (Signed)
PATIENT NAME:  Virginia Carlson, Virginia Carlson MR#:  [id number] DATE OF BIRTH:  November 29, 1960  DATE OF PROCEDURE:  12/08/2013  PREOPERATIVE DIAGNOSIS: Lung cancer with poor venous access.    POSTOPERATIVE DIAGNOSIS: Lung cancer with poor venous access.   PROCEDURES:  1.  Ultrasound guidance for vascular access, right internal jugular vein.  2.  Fluoroscopic guidance for placement of catheter.  3.  Placement of CT compatible Port-A-Cath, right internal jugular vein.   SURGEON:  Algernon Huxley, MD  ANESTHESIA:  Local with moderate conscious sedation.   FLUOROSCOPY TIME:  Less than 1 minute.   CONTRAST:  Zero.   ESTIMATED BLOOD LOSS: 25 mL.  INDICATION FOR PROCEDURE: A 54 year old female with lung cancer. We were asked to place a Port-A-Cath for chemotherapy and durable venous access. Risks and benefits were discussed. Informed consent was obtained.   DESCRIPTION OF THE PROCEDURE:  The patient was brought to the vascular and interventional radiology suite. The right neck and chest were sterilely prepped and draped, and a sterile surgical field was created. Ultrasound was used to help visualize a patent right internal jugular vein. This was then accessed under direct ultrasound guidance without difficulty with a Seldinger needle and a permanent image was recorded. A J-wire was placed. After skin nick and dilatation, the peel-away sheath was then placed over the wire. I then anesthetized an area under the clavicle approximately 2 fingerbreadths. A transverse incision was created and an inferior pocket was created with electrocautery and blunt dissection. The port was then brought onto the field, placed into the pocket and secured to the chest wall with 2 Prolene sutures. The catheter was connected to the port and tunneled from the subclavicular incision to the access site. Fluoroscopic guidance was used to cut the catheter to an appropriate length. The catheter was then placed through the peel-away sheath and the  peel-away sheath was removed. The catheter tip was parked in excellent location in the distal superior vena cava just above the right atrium. The pocket was then irrigated with antibiotic-impregnated saline and the wound was closed with a running 3-0 Vicryl and a 4-0 Monocryl. The access incision was closed with a single 4-0 Monocryl. The Huber needle was used to withdraw blood and flush the port with heparinized saline. Dermabond was then placed as a dressing. The patient tolerated the procedure well and was taken to the recovery room in stable condition.    ____________________________ Algernon Huxley, MD jsd:TT D: 12/08/2013 12:04:55 ET T: 12/08/2013 21:40:15 ET JOB#: [id number]  cc: Algernon Huxley, MD, <Dictator> Algernon Huxley MD ELECTRONICALLY SIGNED 12/10/2013 10:33

## 2014-08-08 NOTE — H&P (Signed)
PATIENT NAME:  Virginia Carlson, HAALAND MR#:  [id number] DATE OF BIRTH:  01-16-1961  DATE OF ADMISSION:  11/19/2013  ADMITTING PHYSICIAN: Gladstone Lighter, MD   PRIMARY CARE PHYSICIAN: None   CHIEF COMPLAINT: Difficulty breathing.    HISTORY OF PRESENT ILLNESS: Virginia Carlson is a 54 year old African American female with past medical history significant for smoking about a pack of cigarettes every day, recent admission 10 days ago for difficulty breathing, diagnosed to have pneumonia and COPD, discharged on Levaquin and prednisone taper. She comes back to the hospital with similar complaints, again going on for more than 3 days now. The last admission the patient was diagnosed with significant fibrosis in the right lung, associated with a small right lung mass, for which Dr. Mortimer Fries had seen the patient and advised bronchoscopy. However, the patient refused bronchoscopy at the time and wanted a second opinion. Since discharge, she has not seen a physician. She is still using her inhalers, but has finished up her prednisone taper and Levaquin several days ago. She states she was fine for a couple of days after discharge, and over the last couple of days, she started feeling short of breath and gradually got worse last night. This morning, she could not breathe and presented to the ER. She continues to have cough, which is productive in nature. She denies any fevers or chills, nausea and vomiting or any other complaints.   PAST MEDICAL HISTORY:  Significant for:  1.  Recent admission for pneumonia.  2.  Possible underlying COPD.  3.  Right lung mass.   PAST SURGICAL HISTORY: None.   ALLERGIES TO MEDICATIONS: None.  CURRENT MEDICATIONS:  1.  She is still on Advair 250/50 one puff b.i.d.    She has 2 more days of  medicine left.  3.  Spiriva inhalation capsule q. daily.   SOCIAL HISTORY: Lives at home with her mom. She continues to smoke about a little less than a pack per day and drinks about 40 ounces of  beer every day. She denies any drug abuse, cocaine use, Xanax.   FAMILY HISTORY: Mom has some kind of cancer, but the patient is not aware of. No heart disease in the family.    REVIEW OF SYSTEMS:  CONSTITUTIONAL: No fever, fatigue, or weakness.  EYES: No blurred vision, double vision, inflammation or glaucoma.  ENT: No tinnitus, ear pain, hearing loss, epistaxis, or discharge.  RESPIRATORY: Positive for cough, wheezing, and COPD. No hemoptysis.  CARDIOVASCULAR: No chest pain, orthopnea, edema, arrhythmia, palpitations, or syncope.  GASTROINTESTINAL: No nausea, vomiting, diarrhea, abdominal pain, hematemesis, or melena.  GENITOURINARY: No dysuria, hematuria, renal calculus, frequency, or incontinence.  ENDOCRINE: No polyuria, nocturia, thyroid problems, heat or cold intolerance.  HEMATOLOGY: No anemia, easy bruising, or bleeding.  SKIN: No acne, rash, or lesions.  MUSCULOSKELETAL: No neck fracture, pain, arthritis, or gout.  NEUROLOGIC: No numbness, weakness, CVA, TIA, or seizures.  PSYCHOLOGICAL: No anxiety, insomnia, depression.   PHYSICAL EXAMINATION:  VITAL SIGNS: Temperature 98.5 degrees Fahrenheit, pulse 114, respirations 24,  blood pressure 121/81, and pulse oximetry 92% on room air.  GENERAL: Well-built, well-nourished female lying in bed, not in any acute distress at this time.  HEENT: Normocephalic, atraumatic. Pupils equal, round, reacting to light. Anicteric sclerae. Extraocular movements intact. Oropharynx clear without erythema, mass, or exudate.   NECK: Supple. No thyromegaly, JVD, or carotid bruits. No lymphadenopathy. Normal range of motion without any pain.  LUNGS: Moving air bilaterally. Decreased breath sounds with rhonchi in the  right lower two thirds of the lung posteriorly. Scattered wheezes noted. No crackles. No use of accessory muscles for breathing.  CARDIOVASCULAR: S1, S2. Regular rate and rhythm. No murmurs, rubs, or gallops.  ABDOMEN: Soft, nontender,  nondistended. No hepatosplenomegaly. Normal bowel sounds.  EXTREMITIES: No pedal edema. No clubbing or cyanosis. There are 2+ dorsalis pedis pulses palpable bilaterally.  SKIN: No acne, rash, or lesions.  LYMPHATICS: No cervical lymphadenopathy.  NEUROLOGIC: Cranial nerves intact. No focal motor or sensory deficit.  PSYCHOLOGICAL: The patient is awake, alert, oriented x 3.   LABORATORY DATA: WBC 4.6, hemoglobin 13.0, hematocrit 38.7, platelet count 269,000.   Sodium 143, potassium 3.4, chloride 107, bicarbonate 26, BUN 8, creatinine 0.57, glucose 95, and calcium of 8.4.   ALT 47, AST 32, alkaline phosphatase 119, total bilirubin 0.2, and albumin  3.5. Troponins less than 0.02.   Chest x-ray showing stable appearance of the chest with extensive heterogeneous interstitial reticular opacities, right mid lung, lower lung, and right-sided pleural parenchymal thickening; the left lung is clear.   EKG showing normal sinus rhythm, heart rate of 99, no acute significant ST-T wave abnormalities.   ASSESSMENT AND PLAN: This is a 54 year old female with recent admission for pneumonia, noted to have a right lung mass. Refused bronchoscopy last admission. Comes back with difficulty breathing again.  1.  Acute chronic obstructive pulmonary disease exacerbation, significant right lung lower lobe fibrosis noted. Started on IV Solu-Medrol,  nebulizers, Spiriva and Advair. Due to her productive cough, started on Rocephin and azithromycin again for now. Pulmonology has been consulted for possible bronchoscopy at this time. The patient is needing 1 to 2 L of oxygen at this time on exertion.  2.  Right lung mass concerning for malignancy. CT chest done last admission. Refused bronchoscopy previously; now agreeable. Pulmonary has been consulted.  3.  Tobacco use disorder. Counseled against smoking for more than 3 minutes. Nicotrol inhaler prescribed.  4.  Hypokalemia, being replaced.  5.  Deep venous thrombosis  prophylaxis with subcutaneous heparin. 6.  Code Status: Full Code.   TIME SPENT ON ADMISSION: Fifty (50) minutes     ____________________________ Gladstone Lighter, MD rk:ms D: 11/19/2013 09:05:50 ET T: 11/19/2013 09:26:30 ET JOB#: [id number]  cc: Gladstone Lighter, MD, <Dictator> Gladstone Lighter MD ELECTRONICALLY SIGNED 12/02/2013 14:12

## 2014-08-08 NOTE — Consult Note (Signed)
Reason for Visit: This 54 year old Female patient presents to the clinic for initial evaluation of  stage IV lung cancer .   Referred by Dr. Grayland Ormond.  Diagnosis:  Chief Complaint/Diagnosis   54 year old female with adenocarcinoma of the lung and multiple brain heads consistent with stage IV lung cancer for whole brain palliative radiation  Pathology Report pathology report reviewed   Imaging Report chest CT MRI of brain reviewed   Referral Report clinical notes reviewed   Planned Treatment Regimen palliative whole brain radiation   HPI   patient is a 54 year old female who had multiple hospitalizations for presumed community-acquired pneumonia. She was noted on CT scan to have a right lower lobe mass along with mediastinal adenopathy suspicious for malignancy. Went on to have subcarinal nodal biopsy showing non-small cell lung cancer consistent with adenocarcinoma. Staging workup including MRI of the brain showed greater than 50 metastatic lesions in the brain. Interestingly she is good performance status with no specific neurologic complaints at this time. She's been started on Decadron therapy. She has a slight cough and a 6 pound weight loss no hemoptysis or chest tightness. CT scan of the chest does show encroachment on the right mainstem bronchus. She's just had a Port-A-Cath placed and is scheduled for systemic chemotherapy I been asked to evaluate her for palliative radiation therapy to her whole brain.  Past, Family and Social History:  Past Medical History positive   Respiratory community-acquired pneumonia   Family History positive   Family History Comments family history of cancer of unknown type.   Social History positive   Social History Comments 20 pack year smoking history no EtOH abuse history   Additional Past Medical and Surgical History seen by herself today   Allergies:   No Known Allergies:   Home Meds:  Home Medications: Medication Instructions Status   albuterol CFC free 90 mcg/inh inhalation aerosol 2 puff(s) inhaled every 6 hours, As Needed - for Shortness of Breath - for Wheezing  Active  Tylenol PM 500 mg-25 mg oral tablet 1 tab(s) orally once a day (at bedtime), As Needed Active   Review of Systems:  General negative   Performance Status (ECOG) 0   Skin negative   Breast negative   Ophthalmologic negative   ENMT negative   Respiratory and Thorax see HPI   Cardiovascular negative   Gastrointestinal negative   Genitourinary negative   Musculoskeletal negative   Neurological negative   Psychiatric negative   Hematology/Lymphatics negative   Endocrine negative   Allergic/Immunologic negative   Review of Systems   denies any weight loss, fatigue, weakness, fever, chills or night sweats. Patient denies any loss of vision, blurred vision. Patient denies any ringing  of the ears or hearing loss. No irregular heartbeat. Patient denies heart murmur or history of fainting. Patient denies any chest pain or pain radiating to her upper extremities. Patient denies any shortness of breath, difficulty breathing at night, cough or hemoptysis. Patient denies any swelling in the lower legs. Patient denies any nausea vomiting, vomiting of blood, or coffee ground material in the vomitus. Patient denies any stomach pain. Patient states has had normal bowel movements no significant constipation or diarrhea. Patient denies any dysuria, hematuria or significant nocturia. Patient denies any problems walking, swelling in the joints or loss of balance. Patient denies any skin changes, loss of hair or loss of weight. Patient denies any excessive worrying or anxiety or significant depression. Patient denies any problems with insomnia. Patient denies excessive  thirst, polyuria, polydipsia. Patient denies any swollen glands, patient denies easy bruising or easy bleeding. Patient denies any recent infections, allergies or URI. Patient "s visual fields  have not changed significantly in recent time.   Nursing Notes:  Nursing Vital Signs and Chemo Nursing Nursing Notes: *CC Vital Signs Flowsheet:   27-Aug-15 09:52  Temp Temperature 98.3  Pulse Pulse 129  Respirations Respirations 18  SBP SBP 109  DBP DBP 80  Pain Scale (0-10)  0  Pulse Oxi  95  Current Weight (kg) (kg) 57.6  Height (cm) centimeters 155  BSA (m2) 1.5   Physical Exam:  General/Skin/HEENT:  General normal   Skin normal   Eyes normal   ENMT normal   Head and Neck normal   Additional PE well-developed female in NAD. She's had recent Port-A-Cath placement right anterior chest wall. No cervical or supraclavicular adenopathy is appreciated. Lungs are clear to A&P cardiac examination shows regular rate and rhythm. Abdomen is benign. Cranial nerves II through XII grossly intact. Visual fields are within normal range. Motor sensory and DTR levels are equal and symmetric in the upper and lower extremities. Proprioception is intact.   Breasts/Resp/CV/GI/GU:  Respiratory and Thorax normal   Cardiovascular normal   Gastrointestinal normal   Genitourinary normal   MS/Neuro/Psych/Lymph:  Musculoskeletal normal   Neurological normal   Lymphatics normal   Other Results:  Radiology Results: LabUnknown:    25-Jul-15 11:10, CT Chest With Contrast  PACS Image     25-Aug-15 16:34, MRI Brain  With/Without Contrast  PACS Image   MRI:  MRI Brain  With/Without Contrast   REASON FOR EXAM:    lung cancer  COMMENTS:       PROCEDURE: MR  - MR BRAIN WO/W CONTRAST  - Dec 09 2013  4:34PM     ADDENDUM REPORT: 12/09/2013 20:04    ADDENDUM:  These results were called by telephone at the time of interpretation  on 12/09/2013 at 8:04 pm to Dr. Ma Hillock, who verbally acknowledged  these results.      Electronically Signed    By: Franchot Gallo M.D.    On: 12/09/2013 20:04    CLINICAL DATA:  Lung cancer    EXAM:  MRI HEAD WITHOUT AND WITH  CONTRAST    TECHNIQUE:  Multiplanar, multiecho pulse sequences of the brain and surrounding  structures were obtained without and with intravenous contrast.    CONTRAST:  12 mL MultiHance IV    COMPARISON:  None.  FINDINGS:  Diffuse metastatic disease to the brain. Too many lesions to count.  Most of these are hemorrhagic with mild amount of hemorrhage seen on  gradient echo imaging. There is little white matter edema. No shift  of the midline structures. No acute infarct. Ventricle size is  normal.    Greater than 50 lesions are present.  Largest lesions include:    Right frontal enhancing mass with central necrosis measures 21 x 18  mm. Left cerebellar mass measures 20 x 16 mm. Left frontal temporal  mass measures 16 x 17 mm. Metastatic deposits also present in the  pons.     IMPRESSION:  Too many to count metastatic deposits in the brain. Many of these  are hemorrhagic. There is little edema in the brain and there is no  shift of the midline structures.    Electronically Signed:  By: Franchot Gallo M.D.  On: 12/09/2013 19:28         Verified By: Truett Perna,  M.D.,  CT:    25-Jul-15 11:10, CT Chest With Contrast  CT Chest With Contrast   REASON FOR EXAM:    pneumonia, weight loss, assess for lung mass and   adenopathy  COMMENTS:       PROCEDURE: CT  - CT CHEST WITH CONTRAST  - Nov 08 2013 11:10AM     CLINICAL DATA:  Other    EXAM:  CT CHEST WITH CONTRAST    TECHNIQUE:  Multidetector CT imaging of the chest was performed during  intravenous contrast administration.  CONTRAST:  75 mL Isovue 370 nonionic    COMPARISON:  November 07, 2013    FINDINGS:  There is patchy airspace consolidation throughout much of the right  lower lobe. Thereis diffuse honeycombing throughout most of the  right lung consistent with widespread underlying fibrotic type  change. There is some sparing in the right upper lobe, particularly  superiorly as well as in a portion of the  superior segment of the  right lower lobe. There is asymmetric interstitial edema throughout  the right lung.    There is a 1.7 x 0.8 cm nodular lesion in the right middle lobe  adjacent to the right heart border, best seen on slice 22 series 3.  On the left, there is much milder fibrosis, primarily in the left  upper lobe. There are areas of patchy atelectasis in the left base.  There is a focal parenchymal nodular opacity in the posterior  segment of the left upper lobe on slice 24 series 3 measuring 4 x 4  mm. There is no consolidation or appreciable edema in the left lung.    There is fairly extensive adenopathy. In the right peritracheal  region, there is an enlarged lymph node measuring 2.8 x 2.1 cm. In  the precarinal region, there is an enlarged lymph node measuring 2.5  x 2.1 cm. To the left of the anterior carina, there is a lymph node  measuring 1.6 by 1.2 cm. In the right hilum, there is adenopathy  with the largest individual lymph node measuring 2.9 by 2.3 cm. In  the sub- carinal region, there are several enlarged lymph nodes,  largest measuring 2.3 by 2.1 cm. There is a lymph node in the  aortopulmonary window region on the left measuring 2.3 by 1.6 cm.  There is a lymph node in the left hilum measuring 1.3 x 1.0 cm.    Pericardium is not appreciably thickened.    In the visualized upper abdomen, there is fatty change in the liver.  The caudate lobe is enlarged, and the left lobe is relatively  hypoplastic. These findings suggest a degree of underlying hepatic  cirrhosis. Adrenals appear within normal limits. There is  atherosclerotic change in the aorta.    Incidental note is made of an apparent right subclavian artery  coursing posterior to the esophagus.    There are no blastic or lytic bone lesions. Thyroid appears  unremarkable.   IMPRESSION:  Extensive adenopathy.    There is a 1.7 x 0.8 cm nodular lesion in the right middle lobe  adjacent to the right  heart border concerning for neoplasm.    Right lower lobe airspace consolidation. Extensive underlying  interstitial fibrosis onthe right with much milder patchy fibrosis  the left. There is asymmetric edema throughout the right lung. Given  the other findings, the possibility of lymphangitic spread of tumor  on the right as the cause for the apparent asymmetric edema must be  of  concern. Atypical infection is possible but felt to be somewhat  less likely given the degree of adenopathy. Given this overall  appearance, bronchoscopy may well be warranted.  4 mm nodular opacity in the left upper lobe.    The appearance of the liver raises question of underlying cirrhosis.      Electronically Signed    By: Lowella Grip M.D.    On: 11/08/2013 11:55         Verified By: Leafy Kindle. 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 We'll plan on delivering 3000 cGy in 10 fractions. I have set her up for CT simulation today. Risks and benefits of treatment including hair loss, cognitive decline possible, irritation of the scalp, alteration of blood counts, all were described in detail to the patient. She seems to comprehend my treatment plan well. Simulation was today. She will begin her treatments early next week. Continues on Decadron therapy.  I would like to take this opportunity for allowing me to participate in the care of your patient..  Electronic Signatures: Armstead Peaks (MD)  (Signed 27-Aug-15 11:24)  Authored: HPI, Diagnosis, PFSH, Allergies, Home Meds, ROS, Nursing Notes, Physical Exam, Other Results, Relevent Results, Encounter Assessment and Plan   Last Updated: 27-Aug-15 11:24 by Armstead Peaks (MD)

## 2014-08-08 NOTE — Discharge Summary (Signed)
PATIENT NAME:  Virginia Carlson, Virginia Carlson MR#:  [id number] DATE OF BIRTH:  Oct 14, 1960  DATE OF ADMISSION:  11/07/2013 DATE OF DISCHARGE:  11/10/2013  DISCHARGE DIAGNOSES:  1.  Pneumonia.  2.  Possible lung mass.  3.  Heavy tobacco abuse.   DISCHARGE MEDICATIONS:  1.  Advair Diskus 250/50 one puff b.i.d.  2.  Spiriva 18 mcg inhalation daily.  3.  Prednisone 20 mg 2 tablets daily for 2 days, 1 tablet daily for 2 days.  4.  Levaquin 500 mg daily for 5 days.   CONSULTATIONS: Pulmonary consult with Dr. Mortimer Fries.  LABORATORY TESTS: Urine toxicology is negative for cocaine, amphetamines. CT chest, shows extensive adenopathy and a 1.7 into 0.87 cm nodular lesion in the right middle lobe adjacent to the right heart border concerning for neoplasm. The patient also has right lower lobe airspace consolidation. Extensive underlying interstitial fibrosis on the right with much milder patchy fibrosis on the left. There is asymmetric edema throughout the right lung. Given the other findings, the possibility of lymphangitic spread of the tumor on the right as the cause of apparent asymmetric edema must be a concern. Patient WBC on July 25th 2.9, hemoglobin 12.5 , hematocrit 38.2, platelets 235,000. Electrolytes on July 25th: Sodium 138, potassium 4.3, chloride 105, bicarbonate 25, BUN 9, creatinine 0.54, glucose 149. HIV is negative. Blood cultures negative. Chest x-ray on admission showed right lung pneumonia.   HOSPITAL COURSE: The patient is a 54 year old female patient with a history of heavy tobacco abuse who was admitted because of trouble breathing for 2 weeks. Patient admitted to hospitalist service because of pneumonia. Patient had some wheezing and sputum cough and productive sputum and weight loss of 3 pounds in the past week. The patient admitted to hospitalist service for possible pneumonia and COPD flare. Started on nebulizers, IV Solu-Medrol, IV Levaquin, Advair, and Spiriva, and patient's wheezing and coughing  improved. However, because of her tobacco abuse, Dr. Mortimer Fries was consulted and CT, chest, was ordered, and CT chest, findings are described as in my dictation. Dr. Mortimer Fries recommended bronchoscopy and biopsy tomorrow. Patient's urine toxicology was negative and patient wanted a second opinion and she does not want a bronchoscopy here, and I advised her that she needs to follow up with another doctor that she wishes to choose regarding her lung findings and bronchoscopy. Patient, Virginia Carlson, and her sister, Virginia Carlson, decided to obtain a second opinion and we strongly encouraged her to consider the bronchoscopy as soon as possible because of her lung tumor mass that was found on the CAT scan. The patient refused to do any further testing here and she says she will after a second opinion. So, we are discharging her with steroids, inhalers, and also antibiotics. Advised her to go to medical records, get the copy of all her records, and to seek the second opinion as soon as possible, and patient is going home.  TIME SPENT ON DISCHARGE: More than 30 minutes.  ____________________________ Epifanio Lesches, MD sk:sk/am D: 11/10/2013 13:37:00 ET T: 11/11/2013 01:01:53 ET JOB#: [id number]  cc: Epifanio Lesches, MD, <Dictator> Epifanio Lesches MD ELECTRONICALLY SIGNED 11/27/2013 23:35

## 2014-08-09 LAB — COMPREHENSIVE METABOLIC PANEL
ALK PHOS: 51 U/L
ANION GAP: 10 (ref 7–16)
AST: 19 U/L
Albumin: 3.6 g/dL
BUN: 11 mg/dL
Bilirubin,Total: 0.4 mg/dL
CALCIUM: 9.2 mg/dL
CHLORIDE: 103 mmol/L
CO2: 28 mmol/L
Creatinine: 0.61 mg/dL
EGFR (African American): >60
EGFR (Non-African Amer.): >60
GLUCOSE: 106 mg/dL — AB
Potassium: 3.9 mmol/L
SGPT (ALT): 19 U/L
SODIUM: 141 mmol/L
Total Protein: 6.9 g/dL

## 2014-08-09 LAB — TSH: Thyroid Stimulating Horm: 0.887 u[IU]/mL

## 2014-08-09 LAB — SEDIMENTATION RATE: ERYTHROCYTE SED RATE: 37 mm/h — AB (ref 0–30)

## 2014-08-10 LAB — PROTIME-INR
INR: 1
Prothrombin Time: 13.1 secs

## 2014-08-10 LAB — HEPARIN LEVEL (UNFRACTIONATED): Anti-Xa(Unfractionated): 0.19 IU/mL — ABNORMAL LOW (ref 0.30–0.70)

## 2014-08-11 LAB — PLATELET COUNT: PLATELETS: 184 10*3/uL (ref 150–440)

## 2014-08-16 NOTE — Consult Note (Signed)
Patient continues to be intermittently confused. Agree with discharge home with hospice. No follow-up has been made in the [place].  Please call with any further questions.  Electronic Signatures: Delight Hoh (MD)  (Signed on 27-Apr-16 13:19)  Authored  Last Updated: 27-Apr-16 13:19 by Delight Hoh (MD)

## 2014-08-16 NOTE — H&P (Signed)
PATIENT NAME:  Virginia Carlson, SCALF MR#:  [id number] DATE OF BIRTH:  02-Nov-1960  DATE OF ADMISSION:  08/05/2014  REFERRING PHYSICIAN: Larae Grooms, MD   PRIMARY CARE PHYSICIAN: None   ONCOLOGY: Kathlene November. Grayland Ormond, MD   CHIEF COMPLAINT: Altered mental status.   HISTORY OF PRESENT ILLNESS: A 54 year old African-Carlson female with history of stage IV adenocarcinoma of the lung with known brain metastases, completed carboplatin and Taxol, Avastin regimen as well as whole brain radiation completed in September 2015 presenting with altered mental status. The patient is unable to provide any meaningful information given mental status and medical condition. Family at bedside are able to provide a limited history only. Apparently, she has had a 2 to 3 day duration of poor p.o. intake including nausea, vomiting, and altered mental status described mainly as confusion. Prior to this, the family states that patient went to a party and consumed alcohol as well as cocaine prior to the onset of symptoms. Given duration of symptoms decided to present to the hospital for further workup and evaluation. Once again, the patient is unable to provide any meaningful information given mental status and medical condition.   REVIEW OF SYSTEMS: Unobtainable given  patient's mental status and medical condition.   PAST MEDICAL HISTORY: Includes stage IV adenocarcinoma of the lung with known brain metastases, had originally had over 33 metastases, she was on treatment course of carboplatin, Taxol, Avastin as well as whole brain radiation, completed radiation in September 2015, as well as history of gastroesophageal reflux disease without esophagitis.   SOCIAL HISTORY: Continued tobacco use, alcohol use, and cocaine use. Alcohol is not daily, family states only on occasion.   FAMILY HISTORY: No known cardiovascular or pulmonary disorders.   ALLERGIES: No known drug allergies.   HOME MEDICATIONS: Per our documentation  include dexamethasone 4 mg p.o. q. daily, oxycodone 10 mg p.o. q. 4 hours as needed for pain, Tylenol PM 500/25 mg p.o. at bedtime as needed, albuterol 90 mcg inhalation 2 puffs q. 6 hours as needed for shortness of breath, magnesium oxide 400 mg p.o. q. daily, potassium 20 mEq p.o. b.i.d., pantoprazole 40 mg p.o. q. daily.   PHYSICAL EXAMINATION:  VITAL SIGNS: Temperature 98.1, heart rate 95, respirations 20, blood pressure 134/76, saturating wellon room air, weight 47.5 kg, BMI 20.5.  GENERAL: Chronically ill-appearing African Carlson female in moderate distress given mental status.  HEAD: Normocephalic, atraumatic.  EYES: Pupils equal, round, react to light. Extraocular muscles intact. No scleral icterus.  MOUTH: Dry mucosal membrane. Dentition intact. No abscess noted.  EAR, NOSE, THROAT: Clear without exudates. No external lesions.  NECK: Supple. No thyromegaly. No nodules. No JVD.  PULMONARY: Clear to auscultation bilaterally without wheezes, rales, or rhonchi. No use of accessory muscles. Good respiratory effort.  CHEST: Nontender to palpation.  CARDIOVASCULAR: S1 and S2, regular rate and rhythm. No murmurs, rubs, gallops. Pedal pulses 2+ bilaterally. No edema.   GASTROINTESTINAL: Soft, nontender, nondistended. No masses. Positive bowel sounds. No hepatosplenomegaly.  MUSCULOSKELETAL: No swelling, clubbing, or edema. Range of motion full in all extremities.  NEUROLOGIC: Unable to fully assess given patient's mental status and medical condition. She is moving all extremities.  Unable to follow simple commands or provide any meaningful information.  SKIN: No ulceration, lesions, rash, or cyanosis. Skin warm and dry. Turgor intact.  PSYCHIATRIC: Mood and affect appears agitated.  She is awake, however, appears not to be alert, alert x 0. She is unable to provide any verbal communication or essentially any  understanding.  Insight and judgment poor at this time.    LABORATORY DATA: Sodium 138,  potassium 4.4, chloride 101, bicarbonate of 27, BUN 19, creatinine 0.64, glucose 115. LFTs: Total protein 8.2, otherwise, within normal limits. Urine drug screen positive for cocaine. WBC of 6.8, hemoglobin 15.6, platelets of 190,000. Urinalysis negative for evidence of infection.   IMAGING: Chest x-ray performed: Chronic changes without any acute abnormalities. Pelvis reveals multifocal metastatic disease. CT head performed: Numerous tiny lesions throughout the brain, the majority appeared to be calcified treated metastases although there may be some tiny enhancing lesions present however no sizable lesion or edema.   ASSESSMENT AND PLAN: A 54 year old Virginia Carlson female with history of stage IV adenocarcinoma of the lung with known brain metastases presenting with altered mental status.  1.  Encephalopathy, possible metabolic versus drug induced given positive cocaine. We will do neuro checks q. 4 hours, benzodiazepines as needed for agitation, avoid Haldol. If no improvement we will need an MRI of her brain to check for further lesions however has no gross edema on her CAT scan.  2.  Gastroesophageal reflux disease without esophagitis, proton pump inhibitor therapy.  3.  Venous thromboembolism prophylaxis, heparin subcutaneous.    CODE STATUS: The patient is a full code as discussed with family at bedside.   TIME SPENT: 45 minutes.    ____________________________ Aaron Mose. Hower, MD dkh:AT D: 08/06/2014 01:19:06 ET T: 08/06/2014 02:27:09 ET JOB#: [id number]  cc: Aaron Mose. Hower, MD, <Dictator> DAVID Woodfin Ganja MD ELECTRONICALLY SIGNED 08/06/2014 20:34

## 2014-08-16 NOTE — Consult Note (Signed)
1. Lung cancer: CT scan of chest in January 2016 significant improvement of disease burden. Patient most recently received cycle 10 of maintenance Avastin on July 14, 2014. She will require a restaging CT of her chest once her mental status clears. Will arrange further follow-up in the [place] upon discharge. Brain metastasis: CT scan results stable. Patient completed her XRT. No intervention needed at this time. Altered mental status/confusion: Likely multifactorial including recent cocaine use. Lumbar puncture today to assess for leptomeningeal disease.Pain: Patient will no longer receive prescription narcotics given her positive drug screen for cocaine. comment further once results of LP are finalized.   Electronic Signatures: Delight Hoh (MD)  (Signed on 25-Apr-16 18:07)  Authored  Last Updated: 25-Apr-16 18:07 by Delight Hoh (MD)

## 2014-08-16 NOTE — Consult Note (Signed)
Referring Physician:  Joanne Gavel   Primary Care Physician:  [facility], [address] [place], [place] [address], [place] [phone]  Reason for Consult: Admit Date: 08-Aug-2014  Chief Complaint: confusion  Reason for Consult: confusion   History of Present Illness: History of Present Illness:   seen at request of Dr. Bridgett Larsson for confusion;  54 yo RHD F with hx of lung CA s/p chemo and XRT presents to [facility] due to confusion.  Pt was apparently hang out with friends at a party where she consumed EtOH and some cocaine.  She has been at [facility] for 3 days with continued confusion without apparent waxing and waning.  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   Past Surgical History none   Home Medications: Medication Instructions Last Modified Date/Time  oxyCODONE 10 mg oral tablet 1 tab(s) orally every 4 hours, As Needed - for Chest Pain - for Pain  21-Apr-16 15:12  dexamethasone 4 mg oral tablet 1 tab(s) orally once a day 21-Apr-16 15:12  magnesium oxide 400 mg oral tablet 1 tab(s) orally once a day 20-Apr-16 21:18  pantoprazole 40 mg oral delayed release tablet 1 tab(s) orally once a day 20-Apr-16 21:18  potassium chloride 20 mEq oral tablet, extended release 1 tab(s) orally 2 times a day 21-Apr-16 15:12  albuterol CFC free 90 mcg/inh inhalation aerosol 2 puff(s) inhaled every 6 hours, As Needed - for Shortness of Breath - for Wheezing  20-Apr-16 21:18  Tylenol PM 500 mg-25 mg  oral tablet 1 tab(s) orally once a day (at bedtime), As Needed 20-Apr-16 21:18   Allergies:  No Known Allergies:   Allergies:  Allergies NKDA   Social/Family History: Lives With: spouse  Living Arrangements: apartment  Social History: + cocaine, +binge drinking, quit tobacco  Family History: no seizures, no stroke   Vital Signs: **Vital Signs.:   24-Apr-16 20:13  Vital Signs Type Routine  Temperature Temperature (F) 97  Celsius 36.1  Temperature Source axillary  Pulse Pulse 83  Respirations Respirations 18  Systolic BP Systolic BP 944  Diastolic BP (mmHg) Diastolic BP (mmHg) 87  Mean BP 109  Pulse Ox % Pulse Ox % 97  Pulse Ox Activity Level  At rest  Oxygen Delivery Room Air/ 21 %   Physical Exam: General: thin, NAD  HEENT: normocephalic, sclera nonicteric, oropharynx clear  Neck: supple, no JVD, no bruits  Chest: CTA B, no wheezing  Cardiac: RRR, no murmurs, no edema, 2+ pulses  Extremities: no C/C/E, FROM   Neurologic Exam: Mental Status: lethargic and oriented only to person, mild dysarthria, rarely follows commands  Cranial Nerves: PERRLA, EOMI, nl VF, face symmetric, tongue midline, shoulder shrug equal  Motor Exam: 3/5 B normal, tone, no tremor  Deep Tendon Reflexes: 1+/4 B, plantars downgoing B, no Hoffman  Sensory Exam: nl to light touch B  Coordination: untestable   Lab  Results:  Thyroid:  24-Apr-16 10:25   Thyroid Stimulating Hormone 0.887 (0.350-4.500 NOTE: New Reference Range  06/23/14)  Hepatic:  24-Apr-16 10:25   Bilirubin, Total 0.4 (0.3-1.2 NOTE: New Reference Range  06/23/14)  Alkaline Phosphatase 51 (38-126 NOTE: New Reference Range  06/23/14)  SGPT (ALT) 19 (14-54 NOTE: New Reference Range  06/23/14)  SGOT (AST) 19 (15-41 NOTE: New Reference Range  06/23/14)  Total Protein, Serum 6.9 (6.5-8.1 NOTE: New Reference Range  06/23/14)  Albumin, Serum 3.6 (3.5-5.0 NOTE: New reference range  06/23/14)  Routine Chem:  20-Apr-16 18:55    Result Comment - K-HEMOLYSIS AT THIS LEVEL MAY AFFECT THE RESULT  Result(s) reported on 05 Aug 2014 at 08:30PM.  21-Apr-16 00:03   Ammonia, Plasma 17 (9-35 NOTE: New Reference Range  06/23/14)  24-Apr-16 10:25   Glucose, Serum  106 (65-99 NOTE: New Reference Range  06/23/14)  BUN 11 (6-20 NOTE: New Reference Range  06/23/14)  Creatinine (comp) 0.61 (0.44-1.00 NOTE: New Reference Range  06/23/14)  Sodium, Serum 141 (135-145 NOTE: New Reference Range  06/23/14)  Potassium, Serum 3.9 (3.5-5.1 NOTE: New Reference Range  06/23/14)  Chloride, Serum 103 (101-111 NOTE: New Reference Range  06/23/14)  CO2, Serum 28 (22-32 NOTE: New Reference Range  06/23/14)  Calcium (Total), Serum 9.2 (8.9-10.3 NOTE: New Reference Range  06/23/14)  eGFR (African American) >60  eGFR (Non-African American) >60 (eGFR values <69m/min/1.73 m2 may be an indication of chronic kidney disease (CKD). Calculated eGFR is useful in patients with stable renal function. The eGFR calculation will not be reliable in acutely ill patients when serum creatinine is changing rapidly. It is not useful in patients on dialysis. The eGFR calculation may not be applicable to patients at the low and high extremes of body sizes, pregnant women, and vegetarians.)  Anion Gap 10  Urine Drugs:  [id number]  Tricyclic Antidepressant, Ur Qual (comp) NEGATIVE (Result(s) reported on 05 Aug 2014 at 11:09PM.)  Amphetamines, Urine Qual. NEGATIVE  MDMA, Urine Qual. NEGATIVE  Cocaine Metabolite, Urine Qual. POSITIVE  Opiate, Urine qual NEGATIVE  Phencyclidine, Urine Qual. NEGATIVE  Cannabinoid, Urine Qual. NEGATIVE  Barbiturates, Urine Qual. NEGATIVE  Benzodiazepine, Urine Qual. NEGATIVE (------------------ The URINE DRUG SCREEN provides only a preliminary, unconfirmed analytical test result and should not be used for non-medical purposes.  Clinical consideration and professional judgment should be applied to any  positive drug screen result due to possible interfering substances.  A more specific alternate chemical method must be used in order to obtain a confirmed analytical result.  Gas chromatography/mass spectrometry (GC/MS) is the preferred confirmatory method.)  Methadone, Urine Qual. NEGATIVE  Cardiac:  20-Apr-16 18:55   Troponin I <0.03 (0.00-0.03 0.03 ng/mL or less: NEGATIVE  Repeat testing in 3-6 hrs  if clinically indicated. >0.05 ng/mL: POTENTIAL  MYOCARDIAL INJURY. Repeat  testing in 3-6 hrs if  clinically indicated. NOTE: An increase or decrease  of 30% or more on serial  testing suggests a  clinically important change NOTE: New Reference Range  06/23/14)  Routine UA:  20-Apr-16 21:36   Color (UA) Yellow  Clarity (UA) Clear  Glucose (UA) Negative  Bilirubin (UA) Negative  Ketones (UA) Trace  Specific Gravity (UA) 1.049  Blood (UA) Negative  pH (UA) 7.0  Protein (UA) 30 mg/dL  Nitrite (UA) Negative  Leukocyte Esterase (UA) Negative (Result(s) reported on 05 Aug 2014 at 10:46PM.)  RBC (UA) 6-30  WBC (UA) 0-5  Bacteria (UA) RARE  Epithelial Cells (UA) 0-5  Mucous (  UA) PRESENT (Result(s) reported on 05 Aug 2014 at 10:46PM.)  Routine Hem:  22-Apr-16 05:32   WBC (CBC) 3.6  RBC (CBC) 3.80  Hemoglobin (CBC) 12.4  Hematocrit (CBC) 37.4  Platelet Count (CBC) 162  MCV 98  MCH 32.6  MCHC 33.1  RDW 14.1  Neutrophil % 61.5  Lymphocyte % 25.7  Monocyte % 11.4  Eosinophil % 0.8  Basophil % 0.6  Neutrophil # 2.2  Lymphocyte #  0.9  Monocyte # 0.4  Eosinophil # 0.0  Basophil # 0.0 (Result(s) reported on 07 Aug 2014 at 06:01AM.)  24-Apr-16 10:25   Erythrocyte Sed Rate  37 (Result(s) reported on 09 Aug 2014 at 10:53AM.)   Radiology Impression: Radiology Impression: CT of head personally reviewed by me and shows stable brain mets   Impression/Recommendations: Recommendations:   prior notes reviewed by me reviewed by me   Delerium-  no clear etiology but could  be to increased brain mets, seizure, encephalopathy; etc. Brain mets-  appear to be stable per CT MRI of brain w/wo contrast EEG start Keppra 575m BID for seizure precaution increase dexamethasone to 432mBID will look for other causes  Electronic Signatures: SmJamison NeighborMD)  (Signed 25-Apr-16 07:59)  Authored: REFERRING PHYSICIAN, Primary Care Physician, Consult, History of Present Illness, Review of Systems, PAST MEDICAL/SURGICAL HISTORY, HOME MEDICATIONS, ALLERGIES, Social/Family History, NURSING VITAL SIGNS, Physical Exam-, LAB RESULTS, RADIOLOGY RESULTS, Recommendations   Last Updated: 25-Apr-16 07:59 by SmJamison NeighborMD)

## 2014-08-16 NOTE — Consult Note (Signed)
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 HPI:   Patient last evaluated in clinic on July 14, 2014 where she received cycle 10 of maintenance Avastin.  Patient is alert and appears to be answering questions appropriately, but is unclear the degree of her comprehension. She currently feels well and offers no specific complaints.   Review of Systems:  General: denies complaints  Performance Status (ECOG): 0  2  Lungs: no complaints  Cardiac: no complaints  GI: no complaints  Musculoskeletal: no complaints  Pain ?: No complaints (0, none)  Emotional well-being: None  Review of Systems:   As per HPI. Otherwise, a complete review of systems is negative.   Allergies:  No Known Allergies:   Smoking History: Smoking History 1/2 Packs per day. Smoking Cessation Information Given to Patient .  PFSH: Additional Past Medical and Surgical History: negative.    Family history: "Cancer", unknown type.    Social history: Tobacco as above, heavy alcohol use, occasional cocaine use.   Home Medications: Medication Instructions Last Modified Date/Time  oxyCODONE 10 mg oral tablet 1 tab(s) orally every 4 hours, As Needed - for Chest Pain - for Pain  21-Apr-16 15:12  dexamethasone 4 mg oral tablet 1 tab(s) orally once a day 21-Apr-16 15:12  magnesium oxide 400 mg oral tablet 1 tab(s) orally once a day 20-Apr-16 21:18  pantoprazole 40 mg oral delayed release tablet 1 tab(s) orally once a day 20-Apr-16 21:18  potassium chloride 20 mEq oral tablet, extended release 1 tab(s) orally 2 times a day 21-Apr-16 15:12  albuterol CFC free 90 mcg/inh inhalation aerosol 2 puff(s) inhaled every 6 hours, As Needed - for Shortness of Breath - for Wheezing  20-Apr-16 21:18  Tylenol PM 500 mg-25 mg oral tablet 1 tab(s) orally once a day (at  bedtime), As Needed 20-Apr-16 21:18   Vital Signs:  :: Vital signs stable, patient afebrile.   Physical Exam:  General: well developed, well nourished, and in no acute distress  Mental Status: normal affect  Eyes: anicteric sclera  Respiratory: clear to auscultation bilaterally  Cardiovascular: regular rate and rhythm, no murmur, rub, or gallop  Gastrointestinal: soft, nondistended, nontender, no organomegaly.  normal active bowel sounds  Musculoskeletal: No edema  Skin: No rash or petechiae noted  Neurological: alert and answering all questions appropriately, but unclear comprehension.   Laboratory Results: Hepatic:  20-Apr-16 18:55   Bilirubin, Total 1.1 (0.3-1.2 NOTE: New Reference Range  06/23/14)  Alkaline Phosphatase 68 (38-126 NOTE: New Reference Range  06/23/14)  SGPT (ALT) 27 (14-54 NOTE: New Reference Range  06/23/14)  SGOT (AST) 27 (15-41 NOTE: New Reference Range  06/23/14)  Total Protein, Serum  8.2 (6.5-8.1 NOTE: New Reference Range  06/23/14)  Albumin, Serum 4.4 (3.5-5.0 NOTE: New reference range  06/23/14)  Routine Chem:  20-Apr-16 18:55   Glucose, Serum  115 (65-99 NOTE: New Reference Range  06/23/14)  BUN 19 (6-20 NOTE: New Reference Range  06/23/14)  Creatinine (comp) 0.64 (0.44-1.00 NOTE: New Reference Range  06/23/14)  Sodium, Serum 138 (135-145 NOTE: New Reference Range  06/23/14)  Potassium, Serum 4.4 (3.5-5.1 NOTE: New Reference Range  06/23/14)  Chloride, Serum  100 (101-111 NOTE: New Reference Range  06/23/14)  CO2, Serum 27 (22-32 NOTE: New Reference Range  06/23/14)  Calcium (Total), Serum 9.8 (8.9-10.3 NOTE: New Reference Range  06/23/14)  eGFR (African American) >60  eGFR (Non-African American) >60 (eGFR values <1m/min/1.73 m2 may be an indication of chronic kidney disease (CKD). Calculated eGFR is useful in patients with stable renal function. The eGFR calculation will not be reliable in acutely ill patients when  serum creatinine is changing rapidly. It is not useful in patients on dialysis. The eGFR calculation may not be applicable to patients at the low and high extremes of body sizes, pregnant women, and vegetarians.)  Result Comment - K-HEMOLYSIS AT THIS LEVEL MAY AFFECT THE RESULT  Result(s) reported on 05 Aug 2014 at 08:30PM.  Anion Gap 11    19:40   Ammonia, Plasma 11 (9-35 NOTE: New Reference Range  06/23/14)  Urine Drugs:  [id number]  Tricyclic Antidepressant, Ur Qual (comp) NEGATIVE (Result(s) reported on 05 Aug 2014 at 11:09PM.)  Amphetamines, Urine Qual. NEGATIVE  MDMA, Urine Qual. NEGATIVE  Cocaine Metabolite, Urine Qual. POSITIVE  Opiate, Urine qual NEGATIVE  Phencyclidine, Urine Qual. NEGATIVE  Cannabinoid, Urine Qual. NEGATIVE  Barbiturates, Urine Qual. NEGATIVE  Benzodiazepine, Urine Qual. NEGATIVE (------------------ The URINE DRUG SCREEN provides only a preliminary, unconfirmed analytical test result and should not be used for non-medical purposes.  Clinical consideration and professional judgment should be applied to any positive drug screen result due to possible interfering substances.  A more specific alternate chemical method must be used in order to obtain a confirmed analytical result.  Gas chromatography/mass spectrometry (GC/MS) is the preferred confirmatory method.)  Methadone, Urine Qual. NEGATIVE  Cardiac:  20-Apr-16 18:55   Troponin I <0.03 (0.00-0.03 0.03 ng/mL or less: NEGATIVE  Repeat testing in 3-6 hrs  if clinically indicated. >0.05 ng/mL: POTENTIAL  MYOCARDIAL INJURY. Repeat  testing in 3-6 hrs if  clinically indicated. NOTE: An increase or decrease  of 30% or more on serial  testing suggests a  clinically important change NOTE: New Reference Range  06/23/14)  Routine UA:  20-Apr-16 21:36   Color (UA) Yellow  Clarity (UA) Clear  Glucose (UA) Negative  Bilirubin (UA) Negative  Ketones (UA) Trace  Specific Gravity (UA) 1.049   Blood (UA) Negative  pH (UA) 7.0  Protein (UA) 30 mg/dL  Nitrite (UA) Negative  Leukocyte Esterase (UA) Negative (Result(s) reported on 05 Aug 2014 at 10:46PM.)  RBC (UA) 6-30  WBC (UA) 0-5  Bacteria (UA) RARE  Epithelial Cells (UA) 0-5  Mucous (UA) PRESENT (Result(s) reported on 05 Aug 2014 at 10:46PM.)  Routine Hem:  20-Apr-16 18:55   WBC (CBC) 6.8  RBC (CBC) 4.77  Hemoglobin (CBC) 15.6  Hematocrit (CBC)  47.2  Platelet Count (CBC) 190 (Result(s) reported on 05 Aug 2014 at 07:24PM.)  MCV 99  MCH 32.8  MCHC 33.1  RDW 14.4   Medical Imaging Results:   Review Medical Imaging   AP Only 05-Aug-2014 19:41:00: IMPRESSION:  1. No acute fracture or malalignment.  2. Multifocal blastic osseous metastatic disease.      Electronically Signed    By: HJacqulynn CadetM.D.    On: 08/05/2014 20:29       Verified By: HCriselda Peaches M.D., Portable Single View 05-Aug-2014 19:41:00: IMPRESSION:  Chronic changes without acute abnormality.      Electronically Signed    By: MInez CatalinaM.D.    On: 08/05/2014 20:30       Verified By: MEverlene Farrier M.D., Head [facility] Center[facility] 05-Aug-2014 21:12:00: IMPRESSION:  1. No evidence of acute large territory infarct or acute  intracranial hemorrhage.  2.  Numerous tiny lesions throughout the brain as above, the majority  of which appear to reflect calcified, treated metastases although  some tiny enhancing lesions may also be present. No sizable  enhancing brain lesion or significant vasogenic edema is seen.  3. Sclerotic lesions in the left occipital condyle and C6 and C7  vertebral bodies, consistent with (possibly treated) metastases.  4. No acute osseous abnormality identified in the cervical spine.      Electronically Signed    By: Logan Bores    On: 08/05/2014 21:36         Verified By: Ferol Luz, M.D., Cervical Spine Without Contrast 05-Aug-2014 21:12:00: IMPRESSION:  1. No evidence of acute large territory infarct or acute  intracranial hemorrhage.   2. Numerous tiny lesions throughout the brain as above, the majority  of which appear to reflect calcified, treated metastases although  some tiny enhancing lesions may also be present. No sizable  enhancing brain lesion or significant vasogenic edema is seen.  3. Sclerotic lesions in the left occipital condyle and C6 and C7  vertebral bodies, consistent with (possiblytreated) metastases.  4. No acute osseous abnormality identified in the cervical spine.      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 Plan:   1. Lung cancer: CT scan of chest in January 2016 significant improvement of disease burden. Patient most recently received cycle 10 of maintenance Avastin on July 14, 2014. She will require a restaging CT of her chest once her mental status clears. Will arrange further follow-up in the [place] upon discharge. Brain metastasis: CT scan results as above with treated metastasis. Patient completed her XRT. No intervention needed at this time. This is unlikely contributing to her confusion and until status changes.Altered mental status/confusion: Likely multifactorial including recent cocaine use. Unlikely related to metastatic disease.Pain: Patient will no longer receive prescription narcotics given her positive drug screen for cocaine. consult, call with questions.  Advance Directive:  Advance Directive Theatre stage manager) no   Advance Directive Information Given patient refused   Electronic Signatures: Delight Hoh (MD)  (Signed 21-Apr-16 18:17)  Authored: Note Type, CC/HPI, Review of Systems, ALLERGIES, Smoking Cessation, Patient Family Social History, HOME MEDICATIONS, Vital Signs, Physical Exam, Lab Results Review, Rad Results  Review, Assessment and Plan, Advance Directive   Last Updated: 21-Apr-16 18:17 by Delight Hoh (MD)

## 2014-08-16 NOTE — Discharge Summary (Signed)
PATIENT NAME:  Virginia Carlson, Virginia Carlson MR#:  [id number] DATE OF BIRTH:  03-07-1961  DATE OF ADMISSION:  08/06/2014 DATE OF DISCHARGE:  08/13/2014  PRIMARY CARE PHYSICIAN:  Dr. Grayland Ormond.    DISCHARGE DIAGNOSES:  1. Acute encephalopathy with persistent confusion.  2. 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  3. Cocaine abuse.   CONDITION:  Poor.   CODE STATUS:  DNR.    HOME MEDICATIONS:  Please refer to the medication reconciliation list.   DIET:  Regular diet.   ACTIVITY:  As tolerated.   FOLLOWUP CARE:  Follow up with PCP p.r.n.  The patient will get hospice care at home.   REASON FOR ADMISSION:  Altered mental status.   HOSPITAL COURSE:  The patient is a 54 year old African American female with a history of stage IV lung adenocarcinoma with metastases who completed chemotherapy.  Was sent to ED due to altered mental status for 2-3 days with poor oral intake including nausea, vomiting.  For detailed history and physical examination, please refer to the admission note dictated by Dr. Lavetta Nielsen.  The patient's CAT scan of head showed numerous tiny lesions throughout the brain.  Urinalysis was negative.  Chest x-ray showed no acute abnormality.  Urine toxicology showed cocaine was positive.  The patient was admitted for encephalopathy possibly due to metabolic versus drug induced given positive cocaine.  After admission, the patient was treated with IV fluid support and benzodiazepine p.r.n. for agitation.  Since the patient continuously had confusion, we requested a neurology consult.  Dr. Tamala Julian suggested an MRI which showed leptomeningeal metastasis.  Dr. Tamala Julian also suggested lumbar puncture and EEG.  The patient failed the lumbar puncture but EEG is abnormal.  Dr. Irish Elders, neurologist, suggests the patient has lung cancer with brain and leptomeningeal metastasis.  She has a very poor prognosis.  We requested a palliative care consult.  Per palliative care consult, the patient  will need hospice at home.  The patient is still confused.  The patient will be discharged to home with hospice.  Discussed the patient's discharge plan with Mr. Karie Georges, palliative care staff and nurse and case manager.   TIME SPENT:  About 38 minutes.    ____________________________ Demetrios Loll, MD qc:kc D: 08/13/2014 13:37:14 ET T: 08/13/2014 19:17:25 ET JOB#: [id number]  cc: Demetrios Loll, MD, <Dictator> Demetrios Loll MD ELECTRONICALLY SIGNED 08/13/2014 20:20

## 2014-08-18 ENCOUNTER — Telehealth: Payer: Self-pay | Admitting: *Deleted

## 2014-08-18 ENCOUNTER — Encounter: Payer: Self-pay | Admitting: *Deleted

## 2014-08-18 NOTE — Telephone Encounter (Signed)
Verbalized understanding

## 2014-09-16 DEATH — deceased

## 2016-07-07 IMAGING — CT CT CHEST W/O CM
2 of 5 series · 12 of 32 positions shown, 18 images · IV contrast (omnipaque)
Comparison: CT 04/30/2014, 03/17/2014

CLINICAL DATA: Restaging lung cancer. Subsequent treatment
evaluation. Stage IV adenocarcinoma along with multiple brain
metastasis.

EXAM:
CT CHEST, ABDOMEN, AND PELVIS WITH CONTRAST
TECHNIQUE: Multidetector CT imaging of the chest, abdomen and pelvis was
performed following the standard protocol during bolus
administration of intravenous contrast. A CT of the chest in abdomen
without contrast was scanned in error.
CONTRAST:  100 mL Omnipaque 300

[Series 4: cap with · axial · 0.64mm/px · z∈[-940,-474]mm · 9 of 117 slices shown, 15 images]
[im 12/117  soft-tissue]
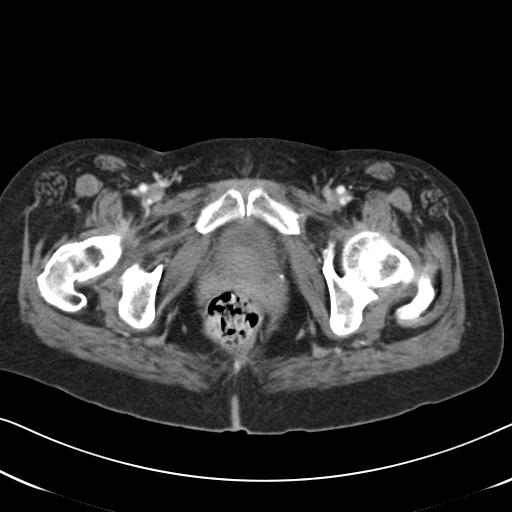
[im 12/117  bone]
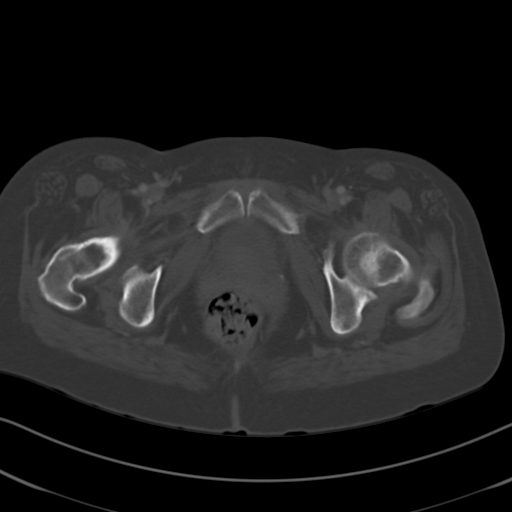
[im 24/117  soft-tissue]
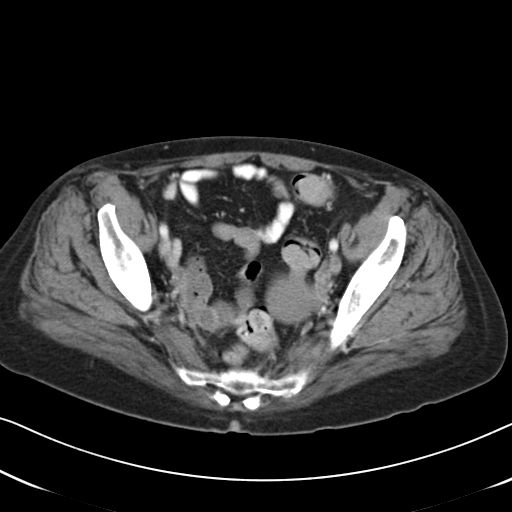
[im 35/117  soft-tissue]
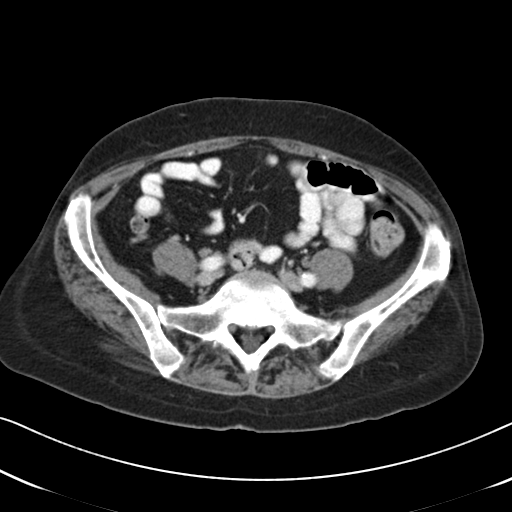
[im 47/117  soft-tissue]
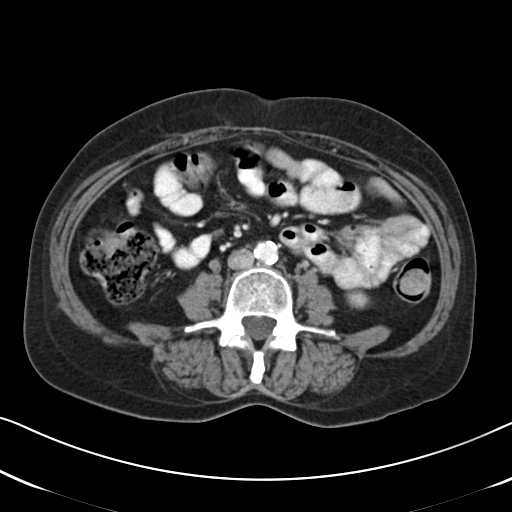
[im 59/117  soft-tissue]
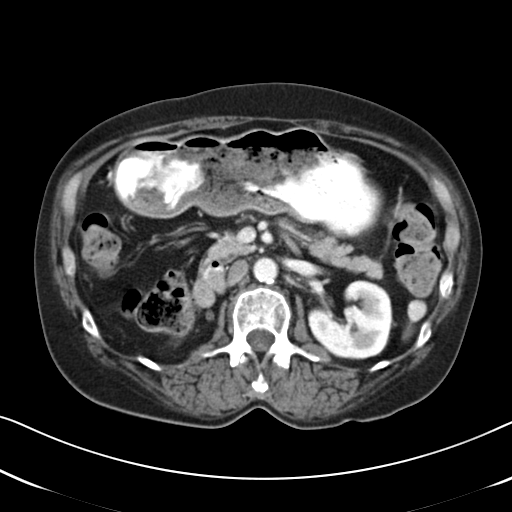
[im 70/117  soft-tissue]
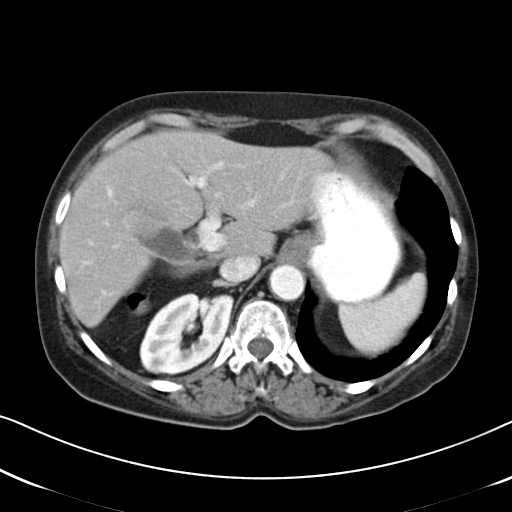
[im 70/117  lung]
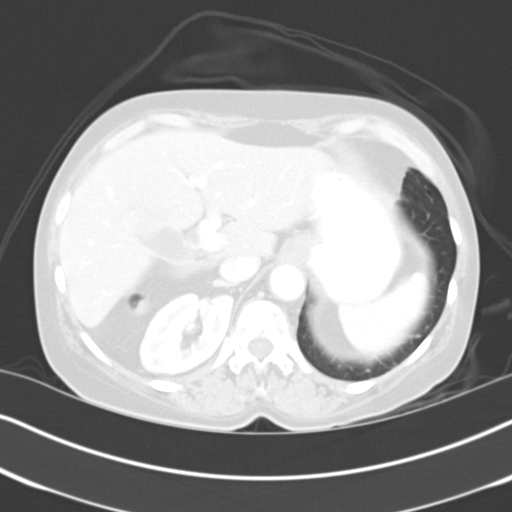
[im 82/117  soft-tissue]
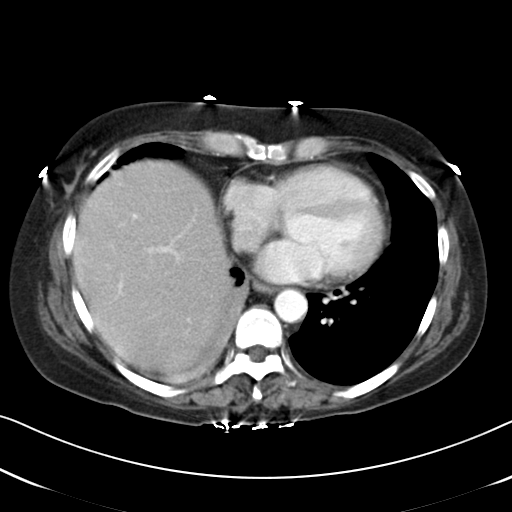
[im 82/117  lung]
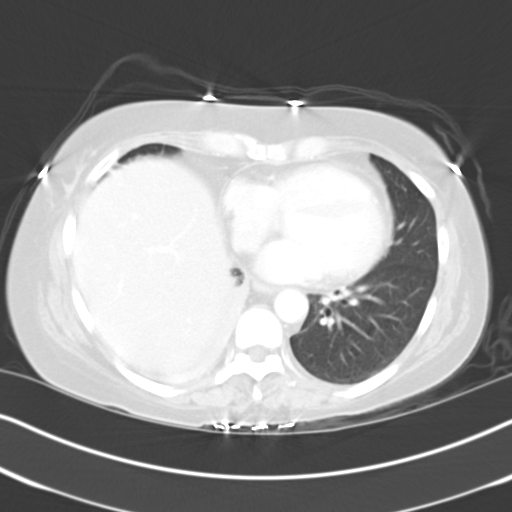
[im 93/117  soft-tissue]
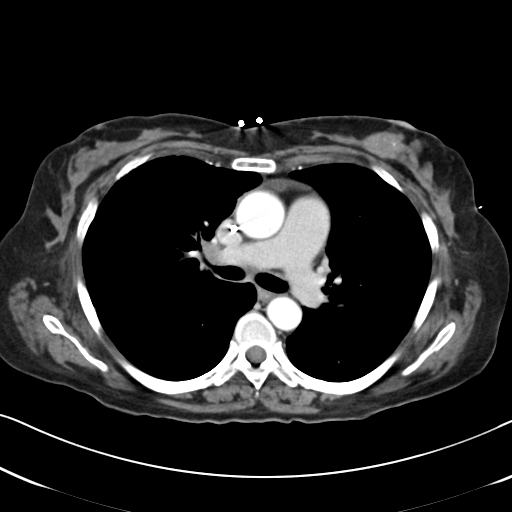
[im 93/117  lung]
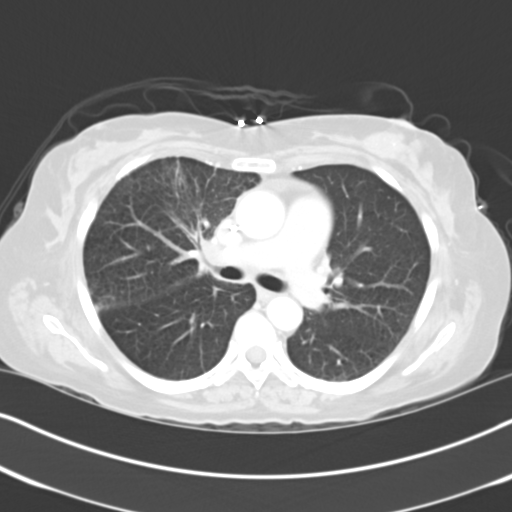
[im 105/117  soft-tissue]
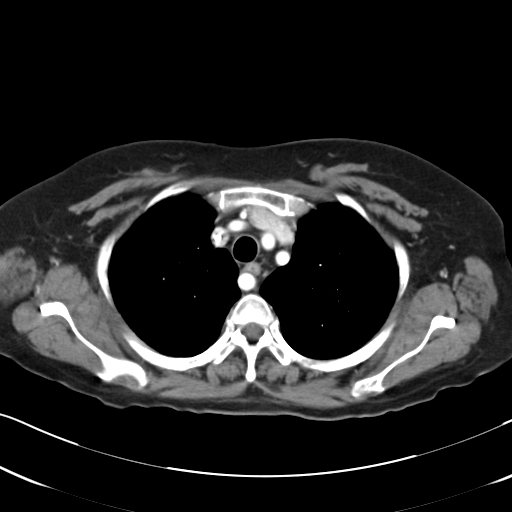
[im 105/117  lung]
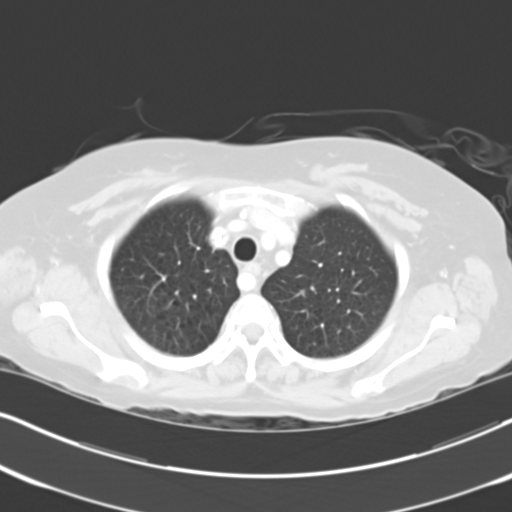
[im 105/117  bone]
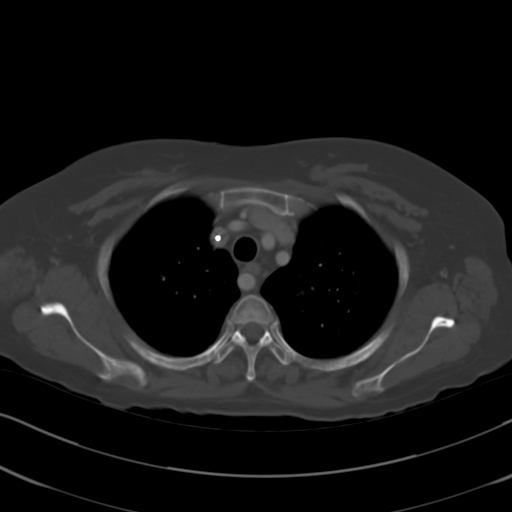

[Series 10: lungs · axial · 0.64mm/px · z∈[-622,-488]mm · 3 of 55 slices shown]
[im 14/55  soft-tissue]
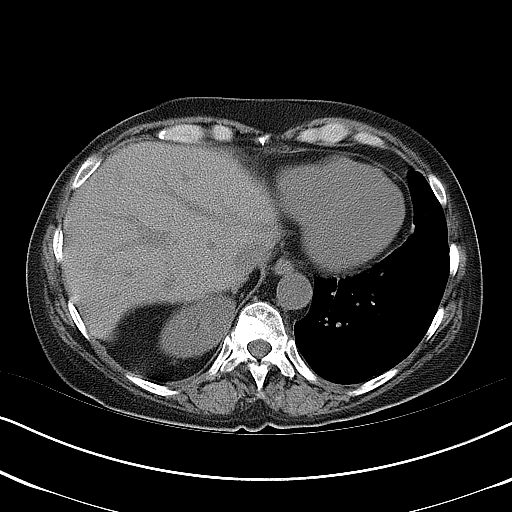
[im 28/55  soft-tissue]
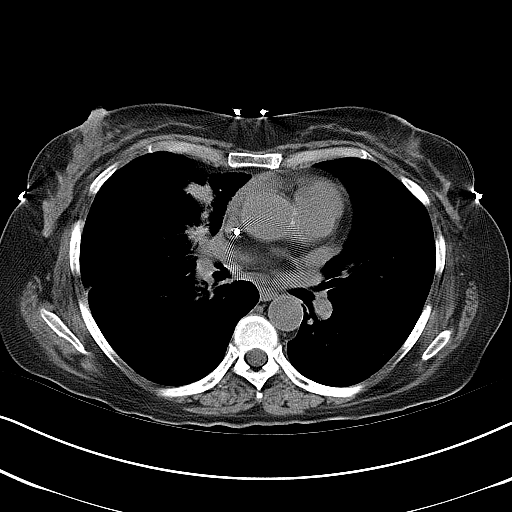
[im 41/55  soft-tissue]
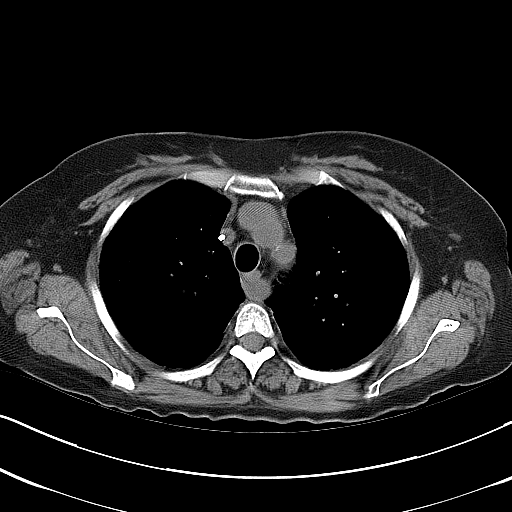

[12 of 32 positions shown; findings below may reference images not displayed]

FINDINGS: CT CHEST FINDINGS

The port in the right anterior chest wall. No axillary or
supraclavicular lymphadenopathy. No mediastinal lymphadenopathy.
Aberrant right subclavian artery. No pericardial fluid.

Chronic elevation of right hemidiaphragm. There is linear nodular
atelectasis in the medial right middle lobe which is not changed
from comparison exam. (Image 31-27 series 7). There is centrilobular
emphysema throughout the lungs.

CT ABDOMEN AND PELVIS FINDINGS

Hepatobiliary: No focal hepatic lesion. No biliary duct dilatation.
Gallbladder is absent. Common bile duct is normal.

Pancreas: Pancreas is normal. No ductal dilatation. No pancreatic
inflammation.

Spleen: Normal spleen

Adrenals/urinary tract: Adrenal glands and kidneys are normal. The
ureters and bladder normal.

Stomach/Bowel: Stomach, small bowel, appendix, and cecum are normal.
The colon and rectosigmoid colon are normal.

Vascular/Lymphatic: Abdominal aorta is normal caliber. There is no
retroperitoneal or periportal lymphadenopathy. No pelvic
lymphadenopathy.

Reproductive: Lobular uterus consistent with leiomyomas.

Musculoskeletal: Multiple sclerotic lesions throughout the pelvis
and spine unchanged from prior.

Other: No free fluid.
IMPRESSION: Chest Impression:

1. Stable bandlike nodular atelectasis in the right middle lobe.
2. Centrilobular emphysema.
3. No evidence of lung cancer recurrence in thorax.

Abdomen / Pelvis Impression:

1. No evidence of metastatic disease in soft tissue the abdomen
pelvis.
2. Stable sclerotic metastasis within the pelvis and spine.
